# Patient Record
Sex: Male | Born: 1990 | Race: White | Hispanic: No | Marital: Single | Smoking: Former smoker
Health system: Southern US, Community
[De-identification: ages and names within clinical notes are randomized; demographics above are authoritative.]

## PROBLEM LIST (undated history)

## (undated) DIAGNOSIS — Z789 Other specified health status: Secondary | ICD-10-CM

## (undated) DIAGNOSIS — Z91018 Allergy to other foods: Secondary | ICD-10-CM

## (undated) HISTORY — PX: WISDOM TOOTH EXTRACTION: SHX21

## (undated) HISTORY — PX: NO PAST SURGERIES: SHX2092

---

## 2009-07-21 ENCOUNTER — Ambulatory Visit: Payer: Self-pay | Admitting: Internal Medicine

## 2017-11-02 ENCOUNTER — Other Ambulatory Visit: Payer: Self-pay

## 2017-11-02 ENCOUNTER — Encounter: Payer: Self-pay | Admitting: Emergency Medicine

## 2017-11-02 ENCOUNTER — Ambulatory Visit
Admission: EM | Admit: 2017-11-02 | Discharge: 2017-11-02 | Disposition: A | Discharge status: Home / Self Care | Payer: 59

## 2017-11-02 DIAGNOSIS — L0291 Cutaneous abscess, unspecified: Secondary | ICD-10-CM | POA: Insufficient documentation

## 2017-11-02 DIAGNOSIS — L03114 Cellulitis of left upper limb: Secondary | ICD-10-CM | POA: Diagnosis not present

## 2017-11-02 DIAGNOSIS — L02414 Cutaneous abscess of left upper limb: Secondary | ICD-10-CM

## 2017-11-02 MED ORDER — CEPHALEXIN 500 MG PO CAPS: 500.0000 mg | ORAL_CAPSULE | Freq: Two times a day (BID) | ORAL | 0 refills | Status: DC

## 2017-11-02 NOTE — ED Provider Notes (Signed)
[facility]    CSN: [id number] Arrival date & time: 11/02/17  1736     History   Chief Complaint Chief Complaint  Patient presents with  . Cellulitis  . Insect Bite    HPI Hector Cox is a 27 y.o. male.   HPI  27 year old male presents with a left forearm cellulitis occurred roughly 6 days ago.  He was seen in the emergency department [facility] emergency and then again at the [facility] walk-in clinic.  On both occasions he was prescribed different medications for MRSA coverage the first with doxycycline and it was discontinued and then eventually placed on Septra which she is currently taking.  At this it has continued to fester and does seem to be worsening instead of better.  Continue taking the Septra.  Had no fever or chills.  Area of firmness and the redness has him increased.                 History reviewed. No pertinent past medical history.  There are no active problems to display for this patient.   History reviewed. No pertinent surgical history.     Home Medications    Prior to Admission medications   Medication Sig Start Date End Date Taking? Authorizing Provider  sulfamethoxazole-trimethoprim (BACTRIM DS,SEPTRA DS) 800-160 MG tablet Take by mouth. 10/30/17 11/09/17 Yes [provider]  cephALEXin (KEFLEX) 500 MG capsule Take 1 capsule (500 mg total) by mouth 2 (two) times daily. 11/02/17   Lutricia Feil, PA-C    Family History History reviewed. No pertinent family history.  Social History Social History   Tobacco Use  . Smoking status: Current Some Day Smoker    Types: Cigarettes  . Smokeless tobacco: Current User    Types: Chew  Substance Use Topics  . Alcohol use: Yes  . Drug use: Never     Allergies   Patient has no known allergies.   Review of Systems Review of Systems  Constitutional: Positive for activity change. Negative for appetite change, chills, fatigue and fever.  Skin: Positive for  color change and wound.  All other systems reviewed and are negative.    Physical Exam Triage Vital Signs ED Triage Vitals  Enc Vitals Group     BP 11/02/17 1752 (!) 125/93     Pulse Rate 11/02/17 1752 96     Resp 11/02/17 1752 16     Temp 11/02/17 1752 98.4 F (36.9 C)     Temp Source 11/02/17 1752 Oral     SpO2 11/02/17 1752 100 %     Weight 11/02/17 1750 167 lb (75.8 kg)     Height 11/02/17 1750 6' (1.829 m)     Head Circumference --      Peak Flow --      Pain Score 11/02/17 1750 0     Pain Loc --      Pain Edu? --      Excl. in GC? --    No data found.  Updated Vital Signs BP (!) 125/93 (BP Location: Left Arm)   Pulse 96   Temp 98.4 F (36.9 C) (Oral)   Resp 16   Ht 6' (1.829 m)   Wt 167 lb (75.8 kg)   SpO2 100%   BMI 22.65 kg/m   Visual Acuity Right Eye Distance:   Left Eye Distance:   Bilateral Distance:    Right Eye Near:   Left Eye Near:    Bilateral Near:  Physical Exam  Constitutional: He is oriented to person, place, and time. He appears well-developed and well-nourished. No distress.  HENT:  Head: Normocephalic.  Eyes: Pupils are equal, round, and reactive to light. Right eye exhibits no discharge. Left eye exhibits no discharge.  Neck: Normal range of motion.  Musculoskeletal: Normal range of motion. He exhibits tenderness.  Neurological: He is alert and oriented to person, place, and time.  Skin: Skin is warm and dry. He is not diaphoretic. There is erythema.  Examination of the left forearm over the ulna mid point shows a 2-1/2 cm in diameter cyst.  There is eschar in the center portion.  Palpation gentle squeezing allows slight discharge of purulence and serosanguineous discharge.  Psychiatric: He has a normal mood and affect. His behavior is normal. Judgment and thought content normal.  Nursing note and vitals reviewed.    UC Treatments / Results  Labs (all labs ordered are listed, but only abnormal results are displayed) Labs  Reviewed  AEROBIC CULTURE (SUPERFICIAL SPECIMEN)    EKG None  Radiology No results found.  Procedures Incision and Drainage Date/Time: 11/02/2017 7:15 PM Performed by: Lutricia Feil, PA-C Authorized by: Lutricia Feil, PA-C   Consent:    Consent obtained:  Verbal   Consent given by:  Patient   Risks discussed:  Bleeding and pain Location:    Type:  Abscess   Location:  Upper extremity   Upper extremity location:  Arm   Arm location:  L lower arm Pre-procedure details:    Skin preparation:  Chloraprep Anesthesia (see MAR for exact dosages):    Anesthesia method:  Local infiltration Procedure type:    Complexity:  Complex Procedure details:    Needle aspiration: no     Incision types:  Single straight   Incision depth:  Subcutaneous   Scalpel blade:  11   Wound management:  Probed and deloculated and debrided   Drainage:  Purulent   Drainage amount:  Moderate   Wound treatment:  Drain placed and wound left open   Packing materials:  1/4 in gauze   Amount 1/4":  3 Post-procedure details:    Patient tolerance of procedure:  Tolerated well, no immediate complications Comments:     Excision of the eschar showed it to be very deep-seated with purulence extending deep into the wound.  This was all excised and debrided creating a crater extending approximaly 6 mm  deep and creating a crater of approximately 3 to 4 mm wide.  Was debrided sharply of all devitalized tissue.  Quarter inch gauze was placed into the depths of the wound keep wound keep open and draining.  A dry sterile dressing was then applied.  Scar and adherent purulent appearing tissue was then submitted for laboratory analysis.  We will keep the area dry and have follow-up in 2 days for possible repacking.  Added Keflex to his regimen of antibiotics for broader coverage.   (including critical care time)  Medications Ordered in UC Medications - No data to display  Initial Impression / Assessment and Plan  / UC Course  I have reviewed the triage vital signs and the nursing notes.  Pertinent labs & imaging results that were available during my care of the patient were reviewed by me and considered in my medical decision making (see chart for details).     Patient will return in 2 days for removal of the packing and repacking if necessary.  He will keep the area dry until  follow-up in 2 days.  If he has any worsening- if he develops any fever or has red streaks extending up his forearm he will go to the emergency room.  Take ibuprofen and Tylenol combined for pain control. Final Clinical Impressions(s) / UC Diagnoses   Final diagnoses:  Abscess     Discharge Instructions     Reinforce your dressing as necessary for drainage which is expected.  Any signs or symptoms of infection that we discussed including increased pain increased drainage fever chills red streaks going up your arm or go to the emergency room or return to our clinic immediately.  Need to return in 2 days for a wound check and for repacking.  Add Keflex to your current regimen of Bactrim for antibiotic   ED Prescriptions    Medication Sig Dispense Auth. Provider   cephALEXin (KEFLEX) 500 MG capsule Take 1 capsule (500 mg total) by mouth 2 (two) times daily. 10 capsule Lutricia Feil, PA-C     Controlled Substance Prescriptions [place] Controlled Substance Registry consulted? Not Applicable   Lutricia Feil, PA-C 11/03/17 1732

## 2017-11-02 NOTE — Discharge Instructions (Signed)
Reinforce your dressing as necessary for drainage which is expected.  Any signs or symptoms of infection that we discussed including increased pain increased drainage fever chills red streaks going up your arm or go to the emergency room or return to our clinic immediately.  Need to return in 2 days for a wound check and for repacking.  Add Keflex to your current regimen of Bactrim for antibiotic

## 2017-11-02 NOTE — ED Triage Notes (Signed)
Patient c/o possible spider bite on his left forearm since Saturday.  Patient seen at the ED and has been treated for cellulitis.  Patient currently on an antibiotic.  Patient here for follow-up.

## 2017-11-05 ENCOUNTER — Ambulatory Visit
Admission: EM | Admit: 2017-11-05 | Discharge: 2017-11-05 | Disposition: A | Discharge status: Home / Self Care | Payer: 59 | Attending: Emergency Medicine | Admitting: Emergency Medicine

## 2017-11-05 ENCOUNTER — Encounter: Payer: Self-pay | Admitting: Emergency Medicine

## 2017-11-05 ENCOUNTER — Other Ambulatory Visit: Payer: Self-pay

## 2017-11-05 DIAGNOSIS — Z5189 Encounter for other specified aftercare: Secondary | ICD-10-CM

## 2017-11-05 DIAGNOSIS — L02414 Cutaneous abscess of left upper limb: Secondary | ICD-10-CM

## 2017-11-05 DIAGNOSIS — B9562 Methicillin resistant Staphylococcus aureus infection as the cause of diseases classified elsewhere: Secondary | ICD-10-CM | POA: Diagnosis not present

## 2017-11-05 DIAGNOSIS — Z4801 Encounter for change or removal of surgical wound dressing: Secondary | ICD-10-CM | POA: Diagnosis not present

## 2017-11-05 DIAGNOSIS — T8141XA Infection following a procedure, superficial incisional surgical site, initial encounter: Secondary | ICD-10-CM | POA: Diagnosis not present

## 2017-11-05 DIAGNOSIS — A4902 Methicillin resistant Staphylococcus aureus infection, unspecified site: Secondary | ICD-10-CM

## 2017-11-05 LAB — AEROBIC CULTURE W GRAM STAIN (SUPERFICIAL SPECIMEN)

## 2017-11-05 LAB — AEROBIC CULTURE  (SUPERFICIAL SPECIMEN): Special Requests: NORMAL

## 2017-11-05 NOTE — Discharge Instructions (Addendum)
Recommend continue Bactrim antibiotic and Keflex antibiotic as prescribed. We repacked your wound today, keep covered and clean. Follow-up in 2 to 3 days (either Wednesday 9/11 or Thursday 9/12) here for recheck.

## 2017-11-05 NOTE — ED Triage Notes (Signed)
Patient in today for follow up of cyst on his left arm.

## 2017-11-05 NOTE — ED Provider Notes (Signed)
[facility]    CSN: [id number] Arrival date & time: 11/05/17  1654     History   Chief Complaint Chief Complaint  Patient presents with  . Follow-up    cyst    HPI Hector Cox is a 27 y.o. male.   27 year old male presents for follow-up for wound care and recheck from 11/02/17. He was originally seen at [facility] about 10 days ago with small cyst and infection on left forearm. He was placed on Doxycycline but redness and swelling continued to get worse so he went to [facility] later that day. He was switched to Septra. The abscess continued to get larger and he was seen here on 11/02/17 and I & D of the abscess was performed, wound culture was obtained and Keflex was added to the Septra. We had packed the wound on 9/6 and the area is improving. Less pain and redness. His wound culture results came back positive for MRSA and sensitive to Septra. He is here for repacking of wound and recheck. Does smoke cigarettes daily. No other chronic health issues. Takes no daily routine medication.   The history is provided by the patient.    History reviewed. No pertinent past medical history.  There are no active problems to display for this patient.   History reviewed. No pertinent surgical history.     Home Medications    Prior to Admission medications   Medication Sig Start Date End Date Taking? Authorizing Provider  cephALEXin (KEFLEX) 500 MG capsule Take 1 capsule (500 mg total) by mouth 2 (two) times daily. 11/02/17  Yes Lutricia Feil, PA-C  naproxen sodium (ALEVE) 220 MG tablet Take 440 mg by mouth daily as needed.   Yes [provider]  sulfamethoxazole-trimethoprim (BACTRIM DS,SEPTRA DS) 800-160 MG tablet Take by mouth. 10/30/17 11/09/17 Yes [provider]    Family History Family History  Problem Relation Age of Onset  . Healthy Mother   . Hypertension Father   . Heart disease Father   . Alcohol abuse Father      Social History Social History   Tobacco Use  . Smoking status: Current Some Day Smoker    Types: Cigarettes  . Smokeless tobacco: Current User    Types: Chew  Substance Use Topics  . Alcohol use: Yes    Comment: socially  . Drug use: Never     Allergies   Patient has no known allergies.   Review of Systems Review of Systems  Constitutional: Negative for activity change, appetite change, chills, fatigue and fever.  Respiratory: Negative for cough, chest tightness, shortness of breath and wheezing.   Gastrointestinal: Negative for abdominal pain, nausea and vomiting.  Musculoskeletal: Negative for arthralgias and myalgias.  Skin: Positive for color change and wound. Negative for rash.  Allergic/Immunologic: Negative for immunocompromised state.  Neurological: Negative for dizziness, tremors, seizures, syncope, weakness, light-headedness, numbness and headaches.  Hematological: Negative for adenopathy. Does not bruise/bleed easily.     Physical Exam Triage Vital Signs ED Triage Vitals  Enc Vitals Group     BP 11/05/17 1707 121/74     Pulse Rate 11/05/17 1707 83     Resp 11/05/17 1707 16     Temp 11/05/17 1707 98.4 F (36.9 C)     Temp Source 11/05/17 1707 Oral     SpO2 11/05/17 1707 99 %     Weight 11/05/17 1706 167 lb 1.7 oz (75.8 kg)  Height 11/05/17 1706 6' (1.829 m)     Head Circumference --      Peak Flow --      Pain Score 11/05/17 1706 0     Pain Loc --      Pain Edu? --      Excl. in GC? --    No data found.  Updated Vital Signs BP 121/74 (BP Location: Right Arm)   Pulse 83   Temp 98.4 F (36.9 C) (Oral)   Resp 16   Ht 6' (1.829 m)   Wt 167 lb 1.7 oz (75.8 kg)   SpO2 99%   BMI 22.66 kg/m   Visual Acuity Right Eye Distance:   Left Eye Distance:   Bilateral Distance:    Right Eye Near:   Left Eye Near:    Bilateral Near:     Physical Exam  Constitutional: He is oriented to person, place, and time. Vital signs are normal. He  appears well-developed and well-nourished. He is cooperative. He does not appear ill. No distress.  Patient sitting comfortably on exam table in no acute distress.   HENT:  Head: Normocephalic and atraumatic.  Eyes: Conjunctivae and EOM are normal.  Neck: Normal range of motion.  Cardiovascular: Normal rate.  Pulmonary/Chest: Effort normal.  Musculoskeletal: Normal range of motion.       Left forearm: He exhibits tenderness and swelling.       Arms: Open wound about 5mm in diameter present on left outer forearm near elbow. Minimal swelling. Slight redness along wound border but no surrounding redness.   Neurological: He is alert and oriented to person, place, and time.  Skin: Skin is warm and dry. Capillary refill takes less than 2 seconds. No bruising, no ecchymosis and no rash noted. There is erythema (slight at wound).  Psychiatric: He has a normal mood and affect. His behavior is normal. Judgment and thought content normal.  Vitals reviewed.    UC Treatments / Results  Labs (all labs ordered are listed, but only abnormal results are displayed) Labs Reviewed - No data to display  EKG None  Radiology No results found.  Procedures Wound Care Date/Time: 11/05/2017 6:21 PM Performed by: Sudie Grumbling, NP Authorized by: Domenick Gong, MD   Consent:    Consent obtained:  Verbal   Consent given by:  Patient   Risks discussed:  Pain, poor cosmetic result and bleeding   Alternatives discussed:  Alternative treatment, observation and delayed treatment Anesthesia (see MAR for exact dosages):    Anesthesia method:  None Procedure details:    Indications: open wounds and skin infection     Wound exploration location: extremity     Wound age (days):  3 Dressing:    Packing/drain action: new packing and packing change     Packing material:  Iodoform 1/4 inch   Dressing applied:  Telfa pad   Wrapped with:  Coban 4 inch Post-procedure details:    Patient tolerance of  procedure:  Tolerated well, no immediate complications Comments:     Removed packing material with ease. Minimal discomfort. Cleaned area with 5ml of normal saline. Depth of wound is about 4mm. Repacked wound with Iodoform. Covered with Telfa pad and Coban.    (including critical care time)  Medications Ordered in UC Medications - No data to display  Initial Impression / Assessment and Plan / UC Course  I have reviewed the triage vital signs and the nursing notes.  Pertinent labs & imaging results that were  available during my care of the patient were reviewed by me and considered in my medical decision making (see chart for details).    We repacked wound today. Healing well. Keep covered and clean. Continue Septra and Keflex as directed. Follow-up here in 2 to 3 days for wound recheck.  Final Clinical Impressions(s) / UC Diagnoses   Final diagnoses:  Infection of wound due to methicillin resistant Staphylococcus aureus (MRSA)  Encounter for wound care     Discharge Instructions     Recommend continue Bactrim antibiotic and Keflex antibiotic as prescribed. We repacked your wound today, keep covered and clean. Follow-up in 2 to 3 days (either Wednesday 9/11 or Thursday 9/12) here for recheck.     ED Prescriptions    None     Controlled Substance Prescriptions [place] Controlled Substance Registry consulted? Not Applicable   Sudie Grumbling, NP 11/06/17 [phone]

## 2017-11-06 ENCOUNTER — Telehealth (HOSPITAL_COMMUNITY): Payer: Self-pay

## 2017-11-06 NOTE — Telephone Encounter (Signed)
Pt is taking bactrim which is appropriate per culture. Pt called and made aware. Reports wound looking better.

## 2017-11-08 ENCOUNTER — Encounter: Payer: Self-pay | Admitting: Emergency Medicine

## 2017-11-08 ENCOUNTER — Other Ambulatory Visit: Payer: Self-pay

## 2017-11-08 ENCOUNTER — Ambulatory Visit
Admission: EM | Admit: 2017-11-08 | Discharge: 2017-11-08 | Disposition: A | Discharge status: Home / Self Care | Payer: PRIVATE HEALTH INSURANCE | Attending: Family Medicine | Admitting: Family Medicine

## 2017-11-08 DIAGNOSIS — L02414 Cutaneous abscess of left upper limb: Secondary | ICD-10-CM

## 2017-11-08 DIAGNOSIS — Z5189 Encounter for other specified aftercare: Secondary | ICD-10-CM

## 2017-11-08 NOTE — Discharge Instructions (Signed)
Daily dressing change.  Clean with soap and water.  Take care  Dr. Adriana Simasook

## 2017-11-08 NOTE — ED Triage Notes (Signed)
Pt here for follow up and packing removal. abscess is located on his left forearm. He reports that it is feeling better, he has started seeing redness and blood. He has been changing the daily and keeping it clean.Hector Cox. He is still taking bactrim.

## 2017-11-08 NOTE — ED Provider Notes (Signed)
[facility]    CSN: [id number] Arrival date & time: 11/08/17  1654  History   Chief Complaint Chief Complaint  Patient presents with  . Wound Check    left forearm, abcess packing removal   HPI  27 year old male presents for wound recheck and packing removal.  Patient was last seen on 9/9.  At that time his wound was repacked.  He remains on Bactrim.  His culture grew MRSA which is sensitive to Bactrim.  Patient states that he is doing well.  Area has dramatically improved.  Patient has no concerns at this time.  Patient here for packing removal and reassessment.  Social History Social History   Tobacco Use  . Smoking status: Current Some Day Smoker    Types: Cigarettes  . Smokeless tobacco: Current User    Types: Chew  Substance Use Topics  . Alcohol use: Yes    Comment: socially  . Drug use: Never     Allergies   Patient has no known allergies.   Review of Systems Review of Systems  Constitutional: Negative for fever.  Skin: Positive for wound.   Physical Exam Triage Vital Signs ED Triage Vitals  Enc Vitals Group     BP 11/08/17 1717 117/71     Pulse Rate 11/08/17 1717 90     Resp 11/08/17 1717 17     Temp 11/08/17 1717 98.5 F (36.9 C)     Temp Source 11/08/17 1717 Oral     SpO2 11/08/17 1717 99 %     Weight 11/08/17 1715 167 lb (75.8 kg)     Height 11/08/17 1715 6' (1.829 m)     Head Circumference --      Peak Flow --      Pain Score 11/08/17 1715 0     Pain Loc --      Pain Edu? --      Excl. in GC? --    Updated Vital Signs BP 117/71 (BP Location: Left Arm)   Pulse 90   Temp 98.5 F (36.9 C) (Oral)   Resp 17   Ht 6' (1.829 m)   Wt 75.8 kg   SpO2 99%   BMI 22.65 kg/m   Visual Acuity Right Eye Distance:   Left Eye Distance:   Bilateral Distance:    Right Eye Near:   Left Eye Near:    Bilateral Near:     Physical Exam  Constitutional: He appears well-developed. No distress.  HENT:  Head: Normocephalic.    Pulmonary/Chest: Effort normal. No respiratory distress.  Neurological: He is alert.  Skin:  Left forearm -abscess does not appear to be draining currently.  No surrounding erythema.  Not particularly tender.  Packing in place.  Nursing note and vitals reviewed.  UC Treatments / Results  Labs (all labs ordered are listed, but only abnormal results are displayed) Labs Reviewed - No data to display  EKG None  Radiology No results found.  Procedures Procedures (including critical care time)  Medications Ordered in UC Medications - No data to display  Initial Impression / Assessment and Plan / UC Course  I have reviewed the triage vital signs and the nursing notes.  Pertinent labs & imaging results that were available during my care of the patient were reviewed by me and considered in my medical decision making (see chart for details).    27 year old male presents for wound check.  Packing removed.  No current purulence.  Appears to be healing appropriately.  Granulation tissue noted.  No need for further packing.  Daily dressing changes.  Clean with soap and water.  Supportive care.  Final Clinical Impressions(s) / UC Diagnoses   Final diagnoses:  Wound check, abscess     Discharge Instructions     Daily dressing change.  Clean with soap and water.  Take care  Dr. Adriana Simas    ED Prescriptions    None     Controlled Substance Prescriptions  Controlled Substance Registry consulted? Not Applicable   Tommie Sams, DO 11/08/17 1734

## 2017-12-31 ENCOUNTER — Ambulatory Visit
Admission: EM | Admit: 2017-12-31 | Discharge: 2017-12-31 | Disposition: A | Discharge status: Home / Self Care | Payer: 59 | Attending: Family Medicine | Admitting: Family Medicine

## 2017-12-31 ENCOUNTER — Ambulatory Visit (INDEPENDENT_AMBULATORY_CARE_PROVIDER_SITE_OTHER): Payer: 59

## 2017-12-31 ENCOUNTER — Other Ambulatory Visit: Payer: Self-pay

## 2017-12-31 DIAGNOSIS — M25511 Pain in right shoulder: Secondary | ICD-10-CM

## 2017-12-31 DIAGNOSIS — S46911A Strain of unspecified muscle, fascia and tendon at shoulder and upper arm level, right arm, initial encounter: Secondary | ICD-10-CM | POA: Diagnosis not present

## 2017-12-31 DIAGNOSIS — S29012A Strain of muscle and tendon of back wall of thorax, initial encounter: Secondary | ICD-10-CM

## 2017-12-31 MED ORDER — CYCLOBENZAPRINE HCL 10 MG PO TABS: 10.0000 mg | ORAL_TABLET | Freq: Every day | ORAL | 0 refills | Status: DC

## 2017-12-31 NOTE — ED Provider Notes (Signed)
[facility]    CSN: [id number] Arrival date & time: 12/31/17  1242     History   Chief Complaint Chief Complaint  Patient presents with  . Shoulder Pain    right    HPI Hector Cox is a 27 y.o. male.   26 yo male with a c/o right shoulder pain for about 6 days. Denies any specific injury or fall, however states has been doing some heavy work at home and work. Denies any numbness/tingling, redness, rash.   The history is provided by the patient.  Shoulder Pain    History reviewed. No pertinent past medical history.  There are no active problems to display for this patient.   Past Surgical History:  Procedure Laterality Date  . NO PAST SURGERIES         Home Medications    Prior to Admission medications   Medication Sig Start Date End Date Taking? Authorizing Provider  naproxen sodium (ALEVE) 220 MG tablet Take 440 mg by mouth daily as needed.   Yes [provider]  cephALEXin (KEFLEX) 500 MG capsule Take 1 capsule (500 mg total) by mouth 2 (two) times daily. 11/02/17   Lutricia Feil, PA-C  cyclobenzaprine (FLEXERIL) 10 MG tablet Take 1 tablet (10 mg total) by mouth at bedtime. 12/31/17   Payton Mccallum, MD    Family History Family History  Problem Relation Age of Onset  . Healthy Mother   . Hypertension Father   . Heart disease Father   . Alcohol abuse Father     Social History Social History   Tobacco Use  . Smoking status: Current Some Day Smoker    Types: Cigarettes  . Smokeless tobacco: Current User    Types: Chew  Substance Use Topics  . Alcohol use: Yes    Comment: socially  . Drug use: Never     Allergies   Patient has no known allergies.   Review of Systems Review of Systems   Physical Exam Triage Vital Signs ED Triage Vitals  Enc Vitals Group     BP 12/31/17 1250 117/82     Pulse Rate 12/31/17 1250 87     Resp 12/31/17 1250 18     Temp 12/31/17 1250 98.1 F (36.7 C)     Temp Source 12/31/17  1250 Oral     SpO2 12/31/17 1250 100 %     Weight 12/31/17 1248 175 lb (79.4 kg)     Height 12/31/17 1248 6' (1.829 m)     Head Circumference --      Peak Flow --      Pain Score 12/31/17 1248 8     Pain Loc --      Pain Edu? --      Excl. in GC? --    No data found.  Updated Vital Signs BP 117/82 (BP Location: Left Arm)   Pulse 87   Temp 98.1 F (36.7 C) (Oral)   Resp 18   Ht 6' (1.829 m)   Wt 79.4 kg   SpO2 100%   BMI 23.73 kg/m   Visual Acuity Right Eye Distance:   Left Eye Distance:   Bilateral Distance:    Right Eye Near:   Left Eye Near:    Bilateral Near:     Physical Exam  Constitutional: He appears well-developed and well-nourished. No distress.  Musculoskeletal:       Right shoulder: He exhibits tenderness and spasm. He exhibits normal range of motion,  no bony tenderness, no swelling, no effusion, no crepitus, no deformity, no laceration, normal pulse and normal strength.       Cervical back: He exhibits tenderness (over the trapezius) and spasm. He exhibits normal range of motion, no bony tenderness, no swelling, no edema, no deformity, no laceration and normal pulse.  Skin: He is not diaphoretic.  Nursing note and vitals reviewed.    UC Treatments / Results  Labs (all labs ordered are listed, but only abnormal results are displayed) Labs Reviewed - No data to display  EKG None  Radiology Dg Shoulder Right  Result Date: 12/31/2017 CLINICAL DATA:  Right shoulder pain which began last week. EXAM: RIGHT SHOULDER - 2+ VIEW COMPARISON:  None FINDINGS: There is no evidence of fracture or dislocation. There is no evidence of arthropathy or other focal bone abnormality. Soft tissues are unremarkable. IMPRESSION: Negative. Electronically Signed   By: Signa Kell M.D.   On: 12/31/2017 13:22    Procedures Procedures (including critical care time)  Medications Ordered in UC Medications - No data to display  Initial Impression / Assessment and Plan /  UC Course  I have reviewed the triage vital signs and the nursing notes.  Pertinent labs & imaging results that were available during my care of the patient were reviewed by me and considered in my medical decision making (see chart for details).      Final Clinical Impressions(s) / UC Diagnoses   Final diagnoses:  Acute pain of right shoulder  Strain of right shoulder, initial encounter  Upper back strain, initial encounter     Discharge Instructions     Heat, massage, stretch, TENS    ED Prescriptions    Medication Sig Dispense Auth. Provider   cyclobenzaprine (FLEXERIL) 10 MG tablet Take 1 tablet (10 mg total) by mouth at bedtime. 30 tablet Payton Mccallum, MD     1. x-ray result (negative shoulder) and diagnosis reviewed with patient 2. rx as per orders above; reviewed possible side effects, interactions, risks and benefits  3. Recommend supportive treatment with otc analgesics prn and as above  4. Follow-up prn if symptoms worsen or don't improve   Controlled Substance Prescriptions [place] Controlled Substance Registry consulted? Not Applicable   Payton Mccallum, MD 12/31/17 [phone]

## 2017-12-31 NOTE — Discharge Instructions (Signed)
Heat, massage, stretch, TENS

## 2017-12-31 NOTE — ED Triage Notes (Signed)
Patient complains of right shoulder pain that started last week. Patient states that pain radiates all around shoulder.

## 2018-01-17 ENCOUNTER — Encounter: Payer: Self-pay | Admitting: Emergency Medicine

## 2018-01-17 ENCOUNTER — Other Ambulatory Visit: Payer: Self-pay

## 2018-01-17 ENCOUNTER — Ambulatory Visit
Admission: EM | Admit: 2018-01-17 | Discharge: 2018-01-17 | Disposition: A | Discharge status: Home / Self Care | Payer: 59 | Attending: Family Medicine | Admitting: Family Medicine

## 2018-01-17 DIAGNOSIS — J069 Acute upper respiratory infection, unspecified: Secondary | ICD-10-CM

## 2018-01-17 DIAGNOSIS — B9789 Other viral agents as the cause of diseases classified elsewhere: Secondary | ICD-10-CM

## 2018-01-17 DIAGNOSIS — J020 Streptococcal pharyngitis: Secondary | ICD-10-CM

## 2018-01-17 LAB — RAPID STREP SCREEN (MED CTR MEBANE ONLY): Streptococcus, Group A Screen (Direct): NEGATIVE

## 2018-01-17 MED ORDER — BENZONATATE 100 MG PO CAPS: 100.0000 mg | ORAL_CAPSULE | Freq: Three times a day (TID) | ORAL | 0 refills | Status: DC | PRN

## 2018-01-17 MED ORDER — CETIRIZINE-PSEUDOEPHEDRINE ER 5-120 MG PO TB12: 1.0000 | ORAL_TABLET | Freq: Every day | ORAL | 0 refills | Status: DC

## 2018-01-17 MED ORDER — LIDOCAINE VISCOUS HCL 2 % MT SOLN: OROMUCOSAL | 0 refills | Status: DC

## 2018-01-17 NOTE — Discharge Instructions (Signed)
This is viral.  Strep negative.  Medications as prescribed.  Take care  Dr. Adriana Simasook

## 2018-01-17 NOTE — ED Provider Notes (Signed)
[facility]    CSN: [id number] Arrival date & time: 01/17/18  1191  History   Chief Complaint Chief Complaint  Patient presents with  . Sore Throat    APPT  . Cough    HPI  27 year old male presents with the above complaints.  2-3 day history of cough, congestion, sore throat.  Associated achiness and fatigue.  No fever.  He has been taking DayQuil and NyQuil with some improvement.  Reports sick contacts at work.  No known exacerbating factors.  Symptoms are worsening.  Moderate in severity.  No other associated symptoms.  No other complaints.  Social hx reviewed. Social History Social History   Tobacco Use  . Smoking status: Current Some Day Smoker    Types: Cigarettes  . Smokeless tobacco: Current User    Types: Chew  Substance Use Topics  . Alcohol use: Yes    Comment: socially  . Drug use: Never     Allergies   Patient has no known allergies.   Review of Systems Review of Systems Per HPI  Physical Exam Triage Vital Signs ED Triage Vitals  Enc Vitals Group     BP 01/17/18 0947 110/75     Pulse Rate 01/17/18 0947 96     Resp 01/17/18 0947 18     Temp 01/17/18 0947 98.5 F (36.9 C)     Temp Source 01/17/18 0947 Oral     SpO2 01/17/18 0947 100 %     Weight 01/17/18 0946 172 lb (78 kg)     Height 01/17/18 0946 6' (1.829 m)     Head Circumference --      Peak Flow --      Pain Score 01/17/18 0945 4     Pain Loc --      Pain Edu? --      Excl. in GC? --    Updated Vital Signs BP 110/75 (BP Location: Right Arm)   Pulse 96   Temp 98.5 F (36.9 C) (Oral)   Resp 18   Ht 6' (1.829 m)   Wt 78 kg   SpO2 100%   BMI 23.33 kg/m   Visual Acuity Right Eye Distance:   Left Eye Distance:   Bilateral Distance:    Right Eye Near:   Left Eye Near:    Bilateral Near:     Physical Exam  Constitutional: He is oriented to person, place, and time. He appears well-developed. No distress.  HENT:  Head: Normocephalic and atraumatic.    Oropharynx with mild erythema.  Eyes: Conjunctivae are normal. Right eye exhibits no discharge. Left eye exhibits no discharge.  Cardiovascular: Normal rate and regular rhythm.  Pulmonary/Chest: Effort normal and breath sounds normal. He has no wheezes. He has no rales.  Neurological: He is alert and oriented to person, place, and time.  Psychiatric: He has a normal mood and affect. His behavior is normal.  Nursing note and vitals reviewed.  UC Treatments / Results  Labs (all labs ordered are listed, but only abnormal results are displayed) Labs Reviewed  RAPID STREP SCREEN ([facility] ONLY)  CULTURE, GROUP A STREP [facility])    EKG None  Radiology No results found.  Procedures Procedures (including critical care time)  Medications Ordered in UC Medications - No data to display  Initial Impression / Assessment and Plan / UC Course  I have reviewed the triage vital signs and the nursing notes.  Pertinent labs & imaging results that were available during my care  of the patient were reviewed by me and considered in my medical decision making (see chart for details).    27 year old male presents with a viral URI with cough.  Treating with Zyrtec-D, Tessalon Perles, viscous lidocaine.  Final Clinical Impressions(s) / UC Diagnoses   Final diagnoses:  Viral URI with cough     Discharge Instructions     This is viral.  Strep negative.  Medications as prescribed.  Take care  Dr. Adriana Simasook     ED Prescriptions    Medication Sig Dispense Auth. Provider   cetirizine-pseudoephedrine (ZYRTEC-D) 5-120 MG tablet Take 1 tablet by mouth daily. 30 tablet Shizuye Rupert G, DO   benzonatate (TESSALON) 100 MG capsule Take 1 capsule (100 mg total) by mouth 3 (three) times daily as needed. 30 capsule Marsean Elkhatib G, DO   lidocaine (XYLOCAINE) 2 % solution Gargle 15 mL every 3 hours as needed. May swallow if desired. 200 mL Tommie Samsook, Zach Tietje G, DO     Controlled Substance Prescriptions [place]  Controlled Substance Registry consulted? Yes, I have consulted the [place] Controlled Substances Registry for this patient, and feel the risk/benefit ratio today is favorable for proceeding with this prescription for a controlled substance.   Tommie SamsCook, Arnulfo Batson G, [place] 01/17/18 1205

## 2018-01-17 NOTE — ED Triage Notes (Signed)
Patient c/o sore throat and cough that started 2 days ago. Patient has been taking Dayquil and Nyquil OTC.

## 2018-01-20 LAB — CULTURE, GROUP A STREP (THRC)

## 2018-01-21 ENCOUNTER — Telehealth (HOSPITAL_COMMUNITY): Payer: Self-pay

## 2018-01-21 NOTE — Telephone Encounter (Signed)
Culture is positive for non group A Strep germ.  This is a finding of uncertain significance; not the typical 'strep throat' germ. Pt is afebrile and no sore throat noted at present. Pt verbalized understanding. Recheck for further evaluation if symptoms are not improving

## 2018-02-04 ENCOUNTER — Ambulatory Visit
Admission: EM | Admit: 2018-02-04 | Discharge: 2018-02-04 | Disposition: A | Discharge status: Home / Self Care | Payer: 59 | Attending: Family Medicine | Admitting: Family Medicine

## 2018-02-04 ENCOUNTER — Encounter: Payer: Self-pay | Admitting: Emergency Medicine

## 2018-02-04 ENCOUNTER — Other Ambulatory Visit: Payer: Self-pay

## 2018-02-04 DIAGNOSIS — W5501XA Bitten by cat, initial encounter: Secondary | ICD-10-CM

## 2018-02-04 DIAGNOSIS — S61250A Open bite of right index finger without damage to nail, initial encounter: Secondary | ICD-10-CM | POA: Diagnosis not present

## 2018-02-04 HISTORY — DX: Allergy to other foods: Z91.018

## 2018-02-04 MED ORDER — AMOXICILLIN-POT CLAVULANATE 875-125 MG PO TABS: 1.0000 | ORAL_TABLET | Freq: Two times a day (BID) | ORAL | 0 refills | Status: DC

## 2018-02-04 NOTE — ED Provider Notes (Signed)
[facility]    CSN: [id number] Arrival date & time: 02/04/18  1103  History   Chief Complaint Chief Complaint  Patient presents with  . Animal Bite    APPT   HPI  27 year old male presents with a cat bite.  Patient states that he was bitten by cat last night.  It is his cat.  Was bitten on the right index finger.  Patient this patient states that it was swollen and red this morning.  Cat is up-to-date on vaccinations.  Patient is up-to-date on his tetanus.  No drainage.  Mild redness.  Patient states that he feels like it is improving currently.  No fever.  No chills.  No other associated symptoms.  No other complaints.  PMH, Surgical Hx, Family Hx, Social History reviewed and updated as below.  Past Medical History:  Diagnosis Date  . Allergy to alpha-gal    Past Surgical History:  Procedure Laterality Date  . NO PAST SURGERIES     Home Medications    Prior to Admission medications   Medication Sig Start Date End Date Taking? Authorizing Provider  ALPRAZolam Prudy Feeler(XANAX) 0.25 MG tablet Take 0.25 mg by mouth daily as needed for anxiety.   Yes [provider]  ibuprofen (ADVIL,MOTRIN) 400 MG tablet Take 400 mg by mouth every 8 (eight) hours as needed for moderate pain (back pain).   Yes [provider]  amoxicillin-clavulanate (AUGMENTIN) 875-125 MG tablet Take 1 tablet by mouth every 12 (twelve) hours. 02/04/18   Tommie Samsook, Orel Cooler G, DO   Family History Family History  Problem Relation Age of Onset  . Healthy Mother   . Hypertension Father   . Heart disease Father   . Alcohol abuse Father    Social History Social History   Tobacco Use  . Smoking status: Current Some Day Smoker    Types: Cigarettes  . Smokeless tobacco: Current User    Types: Chew  Substance Use Topics  . Alcohol use: Yes    Comment: socially  . Drug use: Never   Allergies   Beef-derived products and Pork-derived products   Review of Systems Review of Systems    Constitutional: Negative.   Skin:       Cat bite.   Physical Exam Triage Vital Signs ED Triage Vitals  Enc Vitals Group     BP 02/04/18 1117 116/82     Pulse Rate 02/04/18 1117 88     Resp 02/04/18 1117 16     Temp 02/04/18 1117 98 F (36.7 C)     Temp Source 02/04/18 1117 Oral     SpO2 02/04/18 1117 100 %     Weight 02/04/18 1119 175 lb (79.4 kg)     Height 02/04/18 1119 6' (1.829 m)     Head Circumference --      Peak Flow --      Pain Score 02/04/18 1119 0     Pain Loc --      Pain Edu? --      Excl. in GC? --    Updated Vital Signs BP 116/82 (BP Location: Left Arm)   Pulse 88   Temp 98 F (36.7 C) (Oral)   Resp 16   Ht 6' (1.829 m)   Wt 79.4 kg   SpO2 100%   BMI 23.73 kg/m   Visual Acuity Right Eye Distance:   Left Eye Distance:   Bilateral Distance:    Right Eye Near:   Left Eye Near:  Bilateral Near:     Physical Exam  Constitutional: He is oriented to person, place, and time. He appears well-developed. No distress.  HENT:  Head: Normocephalic and atraumatic.  Pulmonary/Chest: Effort normal. No respiratory distress.  Neurological: He is alert and oriented to person, place, and time.  Skin:  Right index finger -small open wound with mild erythema.  No drainage.  Psychiatric: He has a normal mood and affect. His behavior is normal.  Nursing note and vitals reviewed.  UC Treatments / Results  Labs (all labs ordered are listed, but only abnormal results are displayed) Labs Reviewed - No data to display  EKG None  Radiology No results found.  Procedures Procedures (including critical care time)  Medications Ordered in UC Medications - No data to display  Initial Impression / Assessment and Plan / UC Course  I have reviewed the triage vital signs and the nursing notes.  Pertinent labs & imaging results that were available during my care of the patient were reviewed by me and considered in my medical decision making (see chart for  details).    27 year old male presents with a cat bite.  Placing on prophylactic Augmentin.  Final Clinical Impressions(s) / UC Diagnoses   Final diagnoses:  Cat bite, initial encounter     Discharge Instructions     Antibiotic as prescribed.  Take care  Dr. Adriana Simas    ED Prescriptions    Medication Sig Dispense Auth. Provider   amoxicillin-clavulanate (AUGMENTIN) 875-125 MG tablet Take 1 tablet by mouth every 12 (twelve) hours. 14 tablet Tommie Sams, DO     Controlled Substance Prescriptions [place] Controlled Substance Registry consulted? Not Applicable   Tommie Sams, DO 02/04/18 1232

## 2018-02-04 NOTE — Discharge Instructions (Signed)
Antibiotic as prescribed.  Take care  Dr. Lewin Pellow  

## 2018-02-04 NOTE — ED Triage Notes (Addendum)
Patient in today after getting a cat bite to his right index finger yesterday (02/03/18). Patient states it was his cat and that it is UTD on rabies. Patient's last tetanus was 10/30/17 per [facility] records in care everywhere.

## 2018-05-01 ENCOUNTER — Other Ambulatory Visit: Payer: Self-pay

## 2018-05-01 ENCOUNTER — Ambulatory Visit
Admission: EM | Admit: 2018-05-01 | Discharge: 2018-05-01 | Disposition: A | Discharge status: Home / Self Care | Payer: 59 | Attending: Family Medicine | Admitting: Family Medicine

## 2018-05-01 DIAGNOSIS — J989 Respiratory disorder, unspecified: Secondary | ICD-10-CM

## 2018-05-01 DIAGNOSIS — F17210 Nicotine dependence, cigarettes, uncomplicated: Secondary | ICD-10-CM | POA: Diagnosis not present

## 2018-05-01 MED ORDER — BENZONATATE 100 MG PO CAPS: 100.0000 mg | ORAL_CAPSULE | Freq: Three times a day (TID) | ORAL | 0 refills | Status: DC | PRN

## 2018-05-01 NOTE — Discharge Instructions (Signed)
Likely viral.  Rest. Fluids.  Medication as needed.  No evidence of flu.  Take care  Dr. Adriana Simas

## 2018-05-01 NOTE — ED Provider Notes (Signed)
[facility]    CSN: [id number] Arrival date & time: 05/01/18  1252  History   Chief Complaint Chief Complaint  Patient presents with  . Fever   HPI  28 year old male presents with subjective fever, weakness, lethargy.  Patient reports that he has not been feeling well since Friday.  He reports decreased appetite, fatigue, fever, cough.  No documented fever.  He has not taken his temperature.  He reports his cough is productive.  He has been taking DayQuil without improvement.  Patient is concerned primarily because he has a baby at home.  No known exacerbating factors.  No other associated symptoms.  No other complaints.  PMH, Surgical Hx, Family Hx, Social History reviewed and updated as below.  Past Medical History:  Diagnosis Date  . Allergy to alpha-gal    Past Surgical History:  Procedure Laterality Date  . NO PAST SURGERIES     Home Medications    Prior to Admission medications   Medication Sig Start Date End Date Taking? Authorizing Provider  cyclobenzaprine (FLEXERIL) 10 MG tablet Take 10 mg by mouth 3 (three) times daily as needed for muscle spasms.   Yes [provider]  ALPRAZolam (XANAX) 0.25 MG tablet Take 0.25 mg by mouth daily as needed for anxiety.    [provider]  benzonatate (TESSALON) 100 MG capsule Take 1 capsule (100 mg total) by mouth 3 (three) times daily as needed for cough. 05/01/18   Tommie Sams, DO   Family History Family History  Problem Relation Age of Onset  . Healthy Mother   . Hypertension Father   . Heart disease Father   . Alcohol abuse Father    Social History Social History   Tobacco Use  . Smoking status: Current Some Day Smoker    Types: Cigarettes  . Smokeless tobacco: Current User    Types: Chew  Substance Use Topics  . Alcohol use: Yes    Comment: socially  . Drug use: Never    Allergies   Beef-derived products and Pork-derived products   Review of Systems Review of Systems    Constitutional: Positive for appetite change, fatigue and fever.  Respiratory: Positive for cough.    Physical Exam Triage Vital Signs ED Triage Vitals  Enc Vitals Group     BP 05/01/18 1304 115/71     Pulse Rate 05/01/18 1304 99     Resp 05/01/18 1304 16     Temp 05/01/18 1304 98.3 F (36.8 C)     Temp Source 05/01/18 1304 Oral     SpO2 05/01/18 1304 100 %     Weight 05/01/18 1302 170 lb (77.1 kg)     Height 05/01/18 1302 6\' 2"  (1.88 m)     Head Circumference --      Peak Flow --      Pain Score 05/01/18 1302 6     Pain Loc --      Pain Edu? --      Excl. in GC? --    Updated Vital Signs BP 115/71 (BP Location: Left Arm)   Pulse 99   Temp 98.3 F (36.8 C) (Oral)   Resp 16   Ht 6\' 2"  (1.88 m)   Wt 77.1 kg   SpO2 100%   BMI 21.83 kg/m   Visual Acuity Right Eye Distance:   Left Eye Distance:   Bilateral Distance:    Right Eye Near:   Left Eye Near:    Bilateral Near:  Physical Exam Vitals signs and nursing note reviewed.  Constitutional:      General: He is not in acute distress.    Appearance: Normal appearance.  HENT:     Head: Normocephalic and atraumatic.     Right Ear: Tympanic membrane normal.     Left Ear: Tympanic membrane normal.     Mouth/Throat:     Pharynx: Oropharynx is clear. No oropharyngeal exudate or posterior oropharyngeal erythema.  Eyes:     General:        Right eye: No discharge.        Left eye: No discharge.     Conjunctiva/sclera: Conjunctivae normal.  Cardiovascular:     Rate and Rhythm: Normal rate and regular rhythm.  Pulmonary:     Effort: Pulmonary effort is normal.     Breath sounds: Normal breath sounds.  Neurological:     Mental Status: He is alert.  Psychiatric:        Mood and Affect: Mood normal.        Behavior: Behavior normal.    UC Treatments / Results  Labs (all labs ordered are listed, but only abnormal results are displayed) Labs Reviewed - No data to display  EKG None  Radiology No results  found.  Procedures Procedures (including critical care time)  Medications Ordered in UC Medications - No data to display  Initial Impression / Assessment and Plan / UC Course  I have reviewed the triage vital signs and the nursing notes.  Pertinent labs & imaging results that were available during my care of the patient were reviewed by me and considered in my medical decision making (see chart for details).    28 year old male presents with a viral illness.  Tessalon Perles for cough.  Supportive care.  Final Clinical Impressions(s) / UC Diagnoses   Final diagnoses:  Respiratory illness     Discharge Instructions     Likely viral.  Rest. Fluids.  Medication as needed.  No evidence of flu.  Take care  Dr. Adriana Simas    ED Prescriptions    Medication Sig Dispense Auth. Provider   benzonatate (TESSALON) 100 MG capsule Take 1 capsule (100 mg total) by mouth 3 (three) times daily as needed for cough. 30 capsule Tommie Sams, DO     Controlled Substance Prescriptions [place] Controlled Substance Registry consulted? Not Applicable   Tommie Sams, DO 05/01/18 1454

## 2018-05-01 NOTE — ED Triage Notes (Signed)
Patient complains of fever, weak and lethargic. Patient states that symptoms started on Friday but worsened on Sunday.

## 2019-01-21 ENCOUNTER — Encounter: Payer: Self-pay | Admitting: Emergency Medicine

## 2019-01-21 ENCOUNTER — Other Ambulatory Visit: Payer: Self-pay

## 2019-01-21 ENCOUNTER — Ambulatory Visit
Admission: EM | Admit: 2019-01-21 | Discharge: 2019-01-21 | Disposition: A | Discharge status: Home / Self Care | Payer: 59 | Attending: Family Medicine | Admitting: Family Medicine

## 2019-01-21 DIAGNOSIS — J069 Acute upper respiratory infection, unspecified: Secondary | ICD-10-CM | POA: Diagnosis not present

## 2019-01-21 DIAGNOSIS — R059 Cough, unspecified: Secondary | ICD-10-CM

## 2019-01-21 DIAGNOSIS — R05 Cough: Secondary | ICD-10-CM

## 2019-01-21 MED ORDER — BENZONATATE 100 MG PO CAPS: 200.0000 mg | ORAL_CAPSULE | Freq: Three times a day (TID) | ORAL | 0 refills | Status: AC | PRN

## 2019-01-21 NOTE — Discharge Instructions (Addendum)
URI/COLD SYMPTOMS: Your exam today is consistent with a viral illness. Antibiotics are not indicated at this time. Use medications as directed, including cough syrup or tessalon perles for cough, nasal saline, and decongestants. Your symptoms should improve over the next few days and resolve within 7-10 days. Increase rest and fluids. F/u if symptoms worsen or predominate such as sore throat, ear pain, productive cough, shortness of breath, or if you develop high fevers or worsening fatigue over the next several days.   AS I HAVE EXPLAINED COIVID 19 INFECTION CANNOT BE DEFINITIVELY RULED OUT WITHOUT A TEST. YOU HAVE DECLINED TESTING TODAY. PLEASE STAY HOME IF YOU CAN FOR THE NEXT WEEK OTHERWISE SOCIAL DISTANCE, SANITIZE, [place] HANDS, WEAR MASK AND GLOVES IF POSSIBLE AND FOLLOW UP IF FEVER, WORSENING COUGH, BREATHING ISSUE OR LOSS OF SMELL/TASTE.

## 2019-01-21 NOTE — ED Provider Notes (Signed)
[facility]    CSN: [id number] Arrival date & time: 01/21/19  0932      History   Chief Complaint Chief Complaint  Patient presents with  . Cough    HPI Hector Cox is a 28 y.o. male who presents for onset of nasal congestion and cough 3 days ago. Denies any other symptoms and says he is really only here for cough medication since the OTC medications are not helping as much as he hoped. He denies fever, body aches, fatigue, sore throat, chest pain, breathing difficulty, abdominal pain, n/v/d, or changes in taste or smell. Denies known COVID exposure and declines testing. Is otherwise healthy and denies cardiopulmonary disease. No other concerns today.   HPI  Past Medical History:  Diagnosis Date  . Allergy to alpha-gal     There are no active problems to display for this patient.   Past Surgical History:  Procedure Laterality Date  . NO PAST SURGERIES         Home Medications    Prior to Admission medications   Medication Sig Start Date End Date Taking? Authorizing Provider  ALPRAZolam Prudy Feeler) 0.25 MG tablet Take 0.25 mg by mouth daily as needed for anxiety.   Yes [provider]  benzonatate (TESSALON) 100 MG capsule Take 2 capsules (200 mg total) by mouth 3 (three) times daily as needed for up to 7 days for cough. 01/21/19 01/28/19  Eusebio Friendly B, PA-C  cyclobenzaprine (FLEXERIL) 10 MG tablet Take 10 mg by mouth 3 (three) times daily as needed for muscle spasms.    [provider]    Family History Family History  Problem Relation Age of Onset  . Healthy Mother   . Hypertension Father   . Heart disease Father   . Alcohol abuse Father     Social History Social History   Tobacco Use  . Smoking status: Former Smoker    Types: Cigarettes  . Smokeless tobacco: Former Neurosurgeon    Types: Chew  Substance Use Topics  . Alcohol use: Not Currently    Comment: socially  . Drug use: Never     Allergies   Beef-derived  products and Pork-derived products   Review of Systems Review of Systems  Constitutional: Negative for fatigue and fever.  HENT: Positive for rhinorrhea. Negative for sinus pressure, sinus pain and sore throat.   Respiratory: Negative for shortness of breath.   Cardiovascular: Negative for chest pain.  Gastrointestinal: Negative for abdominal pain, diarrhea and vomiting.  Musculoskeletal: Negative for arthralgias and myalgias.  Neurological: Negative for weakness and headaches.     Physical Exam Triage Vital Signs ED Triage Vitals  Enc Vitals Group     BP 01/21/19 1002 112/79     Pulse Rate 01/21/19 1002 93     Resp 01/21/19 1002 18     Temp 01/21/19 1002 98.5 F (36.9 C)     Temp Source 01/21/19 1002 Oral     SpO2 01/21/19 1002 100 %     Weight 01/21/19 0959 170 lb (77.1 kg)     Height 01/21/19 0959 6' (1.829 m)     Head Circumference --      Peak Flow --      Pain Score 01/21/19 0959 0     Pain Loc --      Pain Edu? --      Excl. in GC? --    No data found.  Updated Vital Signs BP 112/79 (BP Location: Right  Arm)   Pulse 93   Temp 98.5 F (36.9 C) (Oral)   Resp 18   Ht 6' (1.829 m)   Wt 170 lb (77.1 kg)   SpO2 100%   BMI 23.06 kg/m       Physical Exam Vitals signs and nursing note reviewed.  Constitutional:      General: He is not in acute distress.    Appearance: Normal appearance. He is normal weight. He is not ill-appearing or toxic-appearing.  HENT:     Head: Normocephalic and atraumatic.     Nose: Rhinorrhea (trace clear drainage) present.     Mouth/Throat:     Mouth: Mucous membranes are moist.     Pharynx: Oropharynx is clear. No posterior oropharyngeal erythema.  Eyes:     General: No scleral icterus.       Right eye: No discharge.        Left eye: No discharge.     Conjunctiva/sclera: Conjunctivae normal.  Neck:     Musculoskeletal: Neck supple.  Cardiovascular:     Rate and Rhythm: Normal rate and regular rhythm.     Pulses: Normal  pulses.     Heart sounds: No murmur.  Pulmonary:     Effort: Pulmonary effort is normal. No respiratory distress.     Breath sounds: Normal breath sounds. No wheezing or rhonchi.  Lymphadenopathy:     Cervical: No cervical adenopathy.  Skin:    General: Skin is warm and dry.     Findings: No rash.  Neurological:     Mental Status: He is alert.  Psychiatric:        Mood and Affect: Mood normal.        Behavior: Behavior normal.        Thought Content: Thought content normal.      UC Treatments / Results  Labs (all labs ordered are listed, but only abnormal results are displayed) Labs Reviewed - No data to display  EKG   Radiology No results found.  Procedures Procedures (including critical care time)  Medications Ordered in UC Medications - No data to display  Initial Impression / Assessment and Plan / UC Course  I have reviewed the triage vital signs and the nursing notes.  Pertinent labs & imaging results that were available during my care of the patient were reviewed by me and considered in my medical decision making (see chart for details).  Patient presenting with viral URI symptoms. Encouraged COVID testing but he declined. Advised him to stay home since he is not getting test and rest. Increase fluids. May take prescribed benzonatate for cough. Advised to follow up with us for testing if any other possible COVID symptoms develop. ER precautions given.  Final Clinical Impressions(s) / UC Diagnoses   Final diagnoses:  Acute upper respiratory infection  Cough     Discharge Instructions     URI/COLD SYMPTOMS: Your exam today is consistent with a viral illness. Antibiotics are not indicated at this time. Use medications as directed, including cough syrup or tessalon perles for cough, nasal saline, and decongestants. Your symptoms should improve over the next few days and resolve within 7-10 days. Increase rest and fluids. F/u if symptoms worsen or predominate  such as sore throat, ear pain, productive cough, shortness of breath, or if you develop high fevers or worsening fatigue over the next several days.   AS I HAVE EXPLAINED COIVID 19 INFECTION CANNOT BE DEFINITIVELY RULED OUT WITHOUT A TEST. YOU HAVE DECLINED TESTING  TODAY. PLEASE STAY HOME IF YOU CAN FOR THE NEXT WEEK OTHERWISE SOCIAL DISTANCE, SANITIZE, [place] HANDS, WEAR MASK AND GLOVES IF POSSIBLE AND FOLLOW UP IF FEVER, WORSENING COUGH, BREATHING ISSUE OR LOSS OF SMELL/TASTE.    ED Prescriptions    Medication Sig Dispense Auth. Provider   benzonatate (TESSALON) 100 MG capsule Take 2 capsules (200 mg total) by mouth 3 (three) times daily as needed for up to 7 days for cough. 21 capsule Danton Clap, PA-C     PDMP not reviewed this encounter.   Danton Clap, PA-C 01/21/19 1040

## 2019-01-21 NOTE — ED Triage Notes (Signed)
Pt c/o cough, and nasal congestion. Started about 3 days ago. Denies fever, body aches. Declines COVID testing.

## 2019-07-17 ENCOUNTER — Ambulatory Visit
Admission: EM | Admit: 2019-07-17 | Discharge: 2019-07-17 | Disposition: A | Discharge status: Home / Self Care | Payer: 59 | Attending: Family Medicine | Admitting: Family Medicine

## 2019-07-17 ENCOUNTER — Other Ambulatory Visit: Payer: Self-pay

## 2019-07-17 DIAGNOSIS — L255 Unspecified contact dermatitis due to plants, except food: Secondary | ICD-10-CM | POA: Diagnosis not present

## 2019-07-17 MED ORDER — MUPIROCIN 2 % EX OINT: 1.0000 "application " | TOPICAL_OINTMENT | Freq: Two times a day (BID) | CUTANEOUS | 0 refills | Status: AC

## 2019-07-17 MED ORDER — PREDNISONE 10 MG PO TABS: ORAL_TABLET | ORAL | 0 refills | Status: DC

## 2019-07-17 NOTE — ED Provider Notes (Signed)
[facility]    CSN: [id number] Arrival date & time: 07/17/19  0803      History   Chief Complaint Chief Complaint  Patient presents with  . Rash   HPI  29 year old male presents with rash.   Patient has a rash to the left side of his nose.  He states that he has recently been working outside.  Patient states that he is concerned that it may be poison ivy.  It has been raised and blisterlike and is now draining.  It has some crusting on it currently.  He has no other areas affected.  No medications or interventions tried.  No fever.  No other associated symptoms.  No other complaints.  Past Medical History:  Diagnosis Date  . Allergy to alpha-gal    Past Surgical History:  Procedure Laterality Date  . NO PAST SURGERIES     Home Medications    Prior to Admission medications   Medication Sig Start Date End Date Taking? Authorizing Provider  cetirizine (ZYRTEC) 10 MG tablet Take 10 mg by mouth daily.   Yes [provider]  naproxen sodium (ALEVE) 220 MG tablet Take 220 mg by mouth.   Yes [provider]  mupirocin ointment (BACTROBAN) 2 % Apply 1 application topically 2 (two) times daily for 7 days. 07/17/19 07/24/19  Tommie Sams, DO  predniSONE (DELTASONE) 10 MG tablet 50 mg daily x 2 days, then 40 mg daily x 2 days, then 30 mg daily x 2 days, then 20 mg daily x 2 days, then 10 mg daily x 2 days. 07/17/19   Tommie Sams, DO    Family History Family History  Problem Relation Age of Onset  . Healthy Mother   . Hypertension Father   . Heart disease Father   . Alcohol abuse Father     Social History Social History   Tobacco Use  . Smoking status: Former Smoker    Types: Cigarettes  . Smokeless tobacco: Former Neurosurgeon    Types: Chew  Substance Use Topics  . Alcohol use: Not Currently    Comment: socially  . Drug use: Never     Allergies   Beef-derived products and Pork-derived products   Review of Systems Review of Systems    Constitutional: Negative.   Skin: Positive for rash.   Physical Exam Triage Vital Signs ED Triage Vitals [07/17/19 0815]  Enc Vitals Group     BP 128/81     Pulse Rate 79     Resp 16     Temp 98 F (36.7 C)     Temp src      SpO2 100 %     Weight      Height      Head Circumference      Peak Flow      Pain Score 0     Pain Loc      Pain Edu?      Excl. in GC?    Updated Vital Signs BP 128/81   Pulse 79   Temp 98 F (36.7 C)   Resp 16   SpO2 100%   Visual Acuity Right Eye Distance:   Left Eye Distance:   Bilateral Distance:    Right Eye Near:   Left Eye Near:    Bilateral Near:     Physical Exam Vitals and nursing note reviewed.  Constitutional:      General: He is not in acute distress.  Appearance: Normal appearance. He is not ill-appearing.  HENT:     Head: Normocephalic and atraumatic.     Nose:     Comments: Left side of the nose with raised, erythematous rash.  Mild crusting. Eyes:     General:        Right eye: No discharge.        Left eye: No discharge.     Conjunctiva/sclera: Conjunctivae normal.  Pulmonary:     Effort: Pulmonary effort is normal. No respiratory distress.  Neurological:     Mental Status: He is alert.  Psychiatric:        Mood and Affect: Mood normal.        Behavior: Behavior normal.    UC Treatments / Results  Labs (all labs ordered are listed, but only abnormal results are displayed) Labs Reviewed - No data to display  EKG   Radiology No results found.  Procedures Procedures (including critical care time)  Medications Ordered in UC Medications - No data to display  Initial Impression / Assessment and Plan / UC Course  I have reviewed the triage vital signs and the nursing notes.  Pertinent labs & imaging results that were available during my care of the patient were reviewed by me and considered in my medical decision making (see chart for details).    29 year old male presents with dermatitis  likely secondary to poison oak or poison ivy.  Treating with prednisone and also placing on mupirocin to cover for potential bacterial superinfection.  Final Clinical Impressions(s) / UC Diagnoses   Final diagnoses:  Dermatitis due to plants, including poison ivy, sumac, and oak     Discharge Instructions     Medication as prescribed.   Take care  Dr. Lacinda Axon    ED Prescriptions    Medication Sig Dispense Auth. Provider   predniSONE (DELTASONE) 10 MG tablet 50 mg daily x 2 days, then 40 mg daily x 2 days, then 30 mg daily x 2 days, then 20 mg daily x 2 days, then 10 mg daily x 2 days. 30 tablet Vaniya Augspurger G, DO   mupirocin ointment (BACTROBAN) 2 % Apply 1 application topically 2 (two) times daily for 7 days. 22 g Coral Spikes, DO     PDMP not reviewed this encounter.   Coral Spikes, DO 07/17/19 1031

## 2019-07-17 NOTE — ED Triage Notes (Signed)
Pt has rash to nose since Monday after working outside, concerned about poison ivy.

## 2019-07-17 NOTE — Discharge Instructions (Signed)
Medication as prescribed.  Take care  Dr. Caoilainn Sacks  

## 2019-09-04 ENCOUNTER — Ambulatory Visit: Payer: 59 | Attending: Otolaryngology

## 2019-09-04 DIAGNOSIS — R0683 Snoring: Secondary | ICD-10-CM | POA: Insufficient documentation

## 2019-09-04 DIAGNOSIS — G4733 Obstructive sleep apnea (adult) (pediatric): Secondary | ICD-10-CM | POA: Insufficient documentation

## 2019-09-05 ENCOUNTER — Other Ambulatory Visit: Payer: Self-pay

## 2019-10-29 IMAGING — CR DG SHOULDER 2+V*R*
3 series · 3 of 3 positions shown · non-contrast
Comparison: None

CLINICAL DATA: Right shoulder pain which began last week.

EXAM:
RIGHT SHOULDER - 2+ VIEW

[shoulder y view]
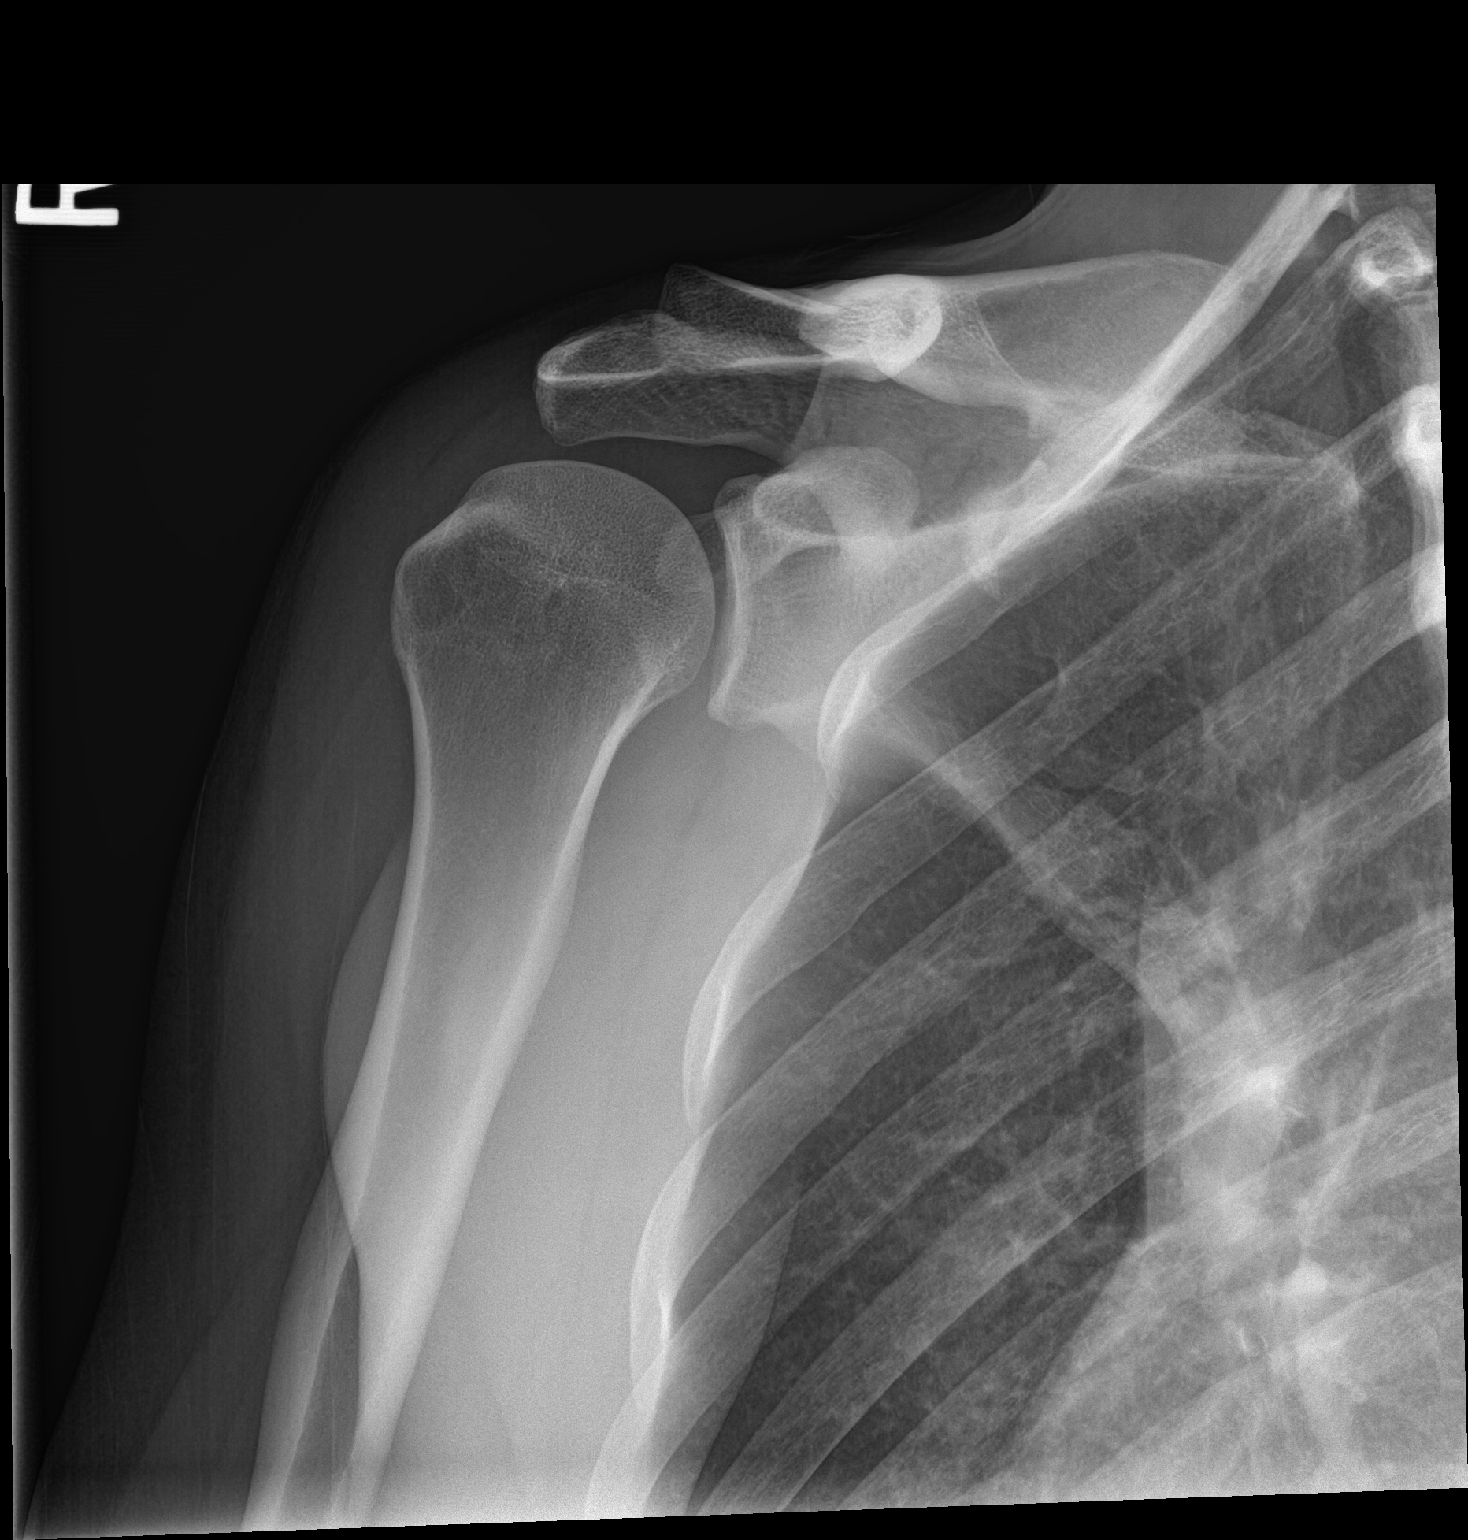

[shoulder axial (1 of 2)]
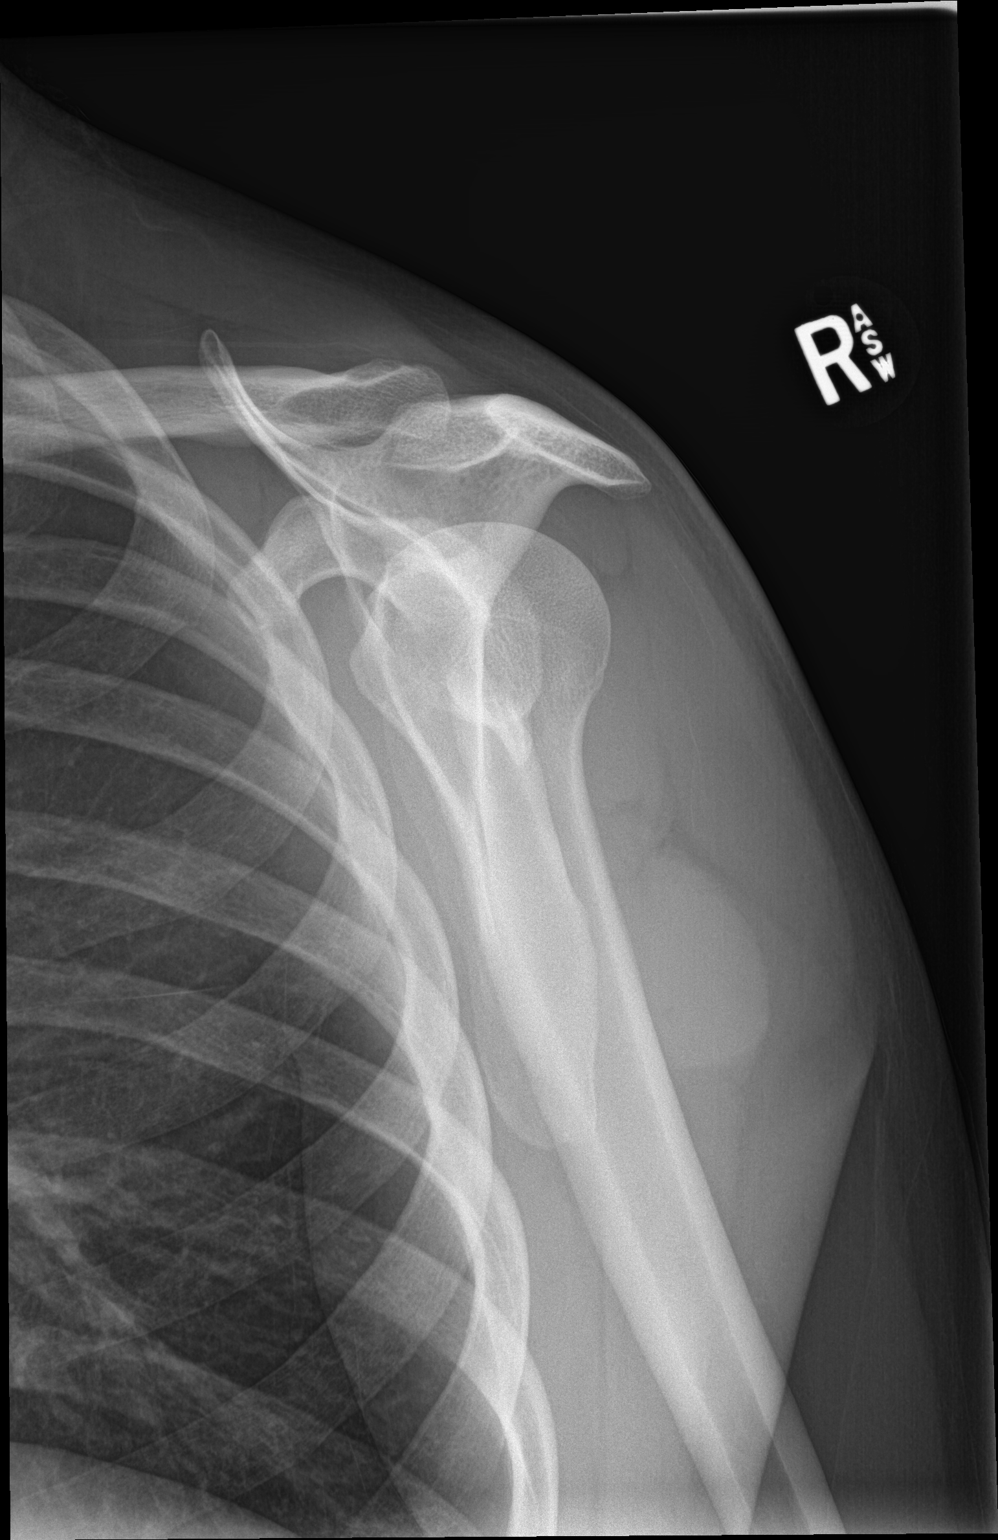

[shoulder axial (2 of 2)]
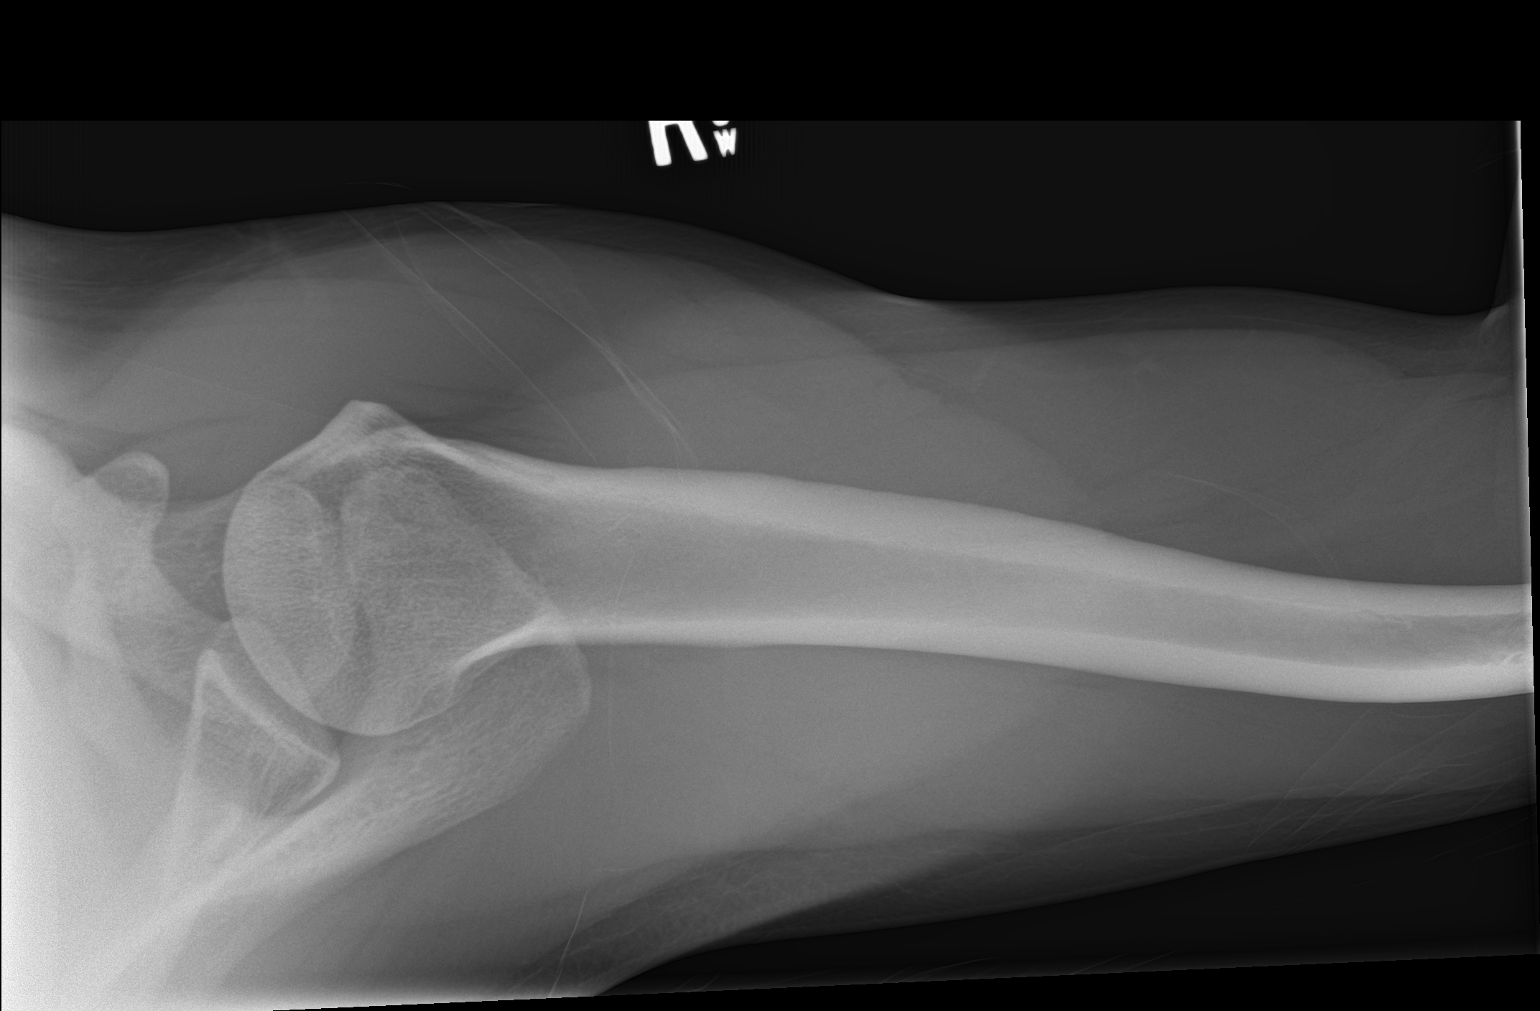

[3 of 3 positions shown; findings below may reference images not displayed]

FINDINGS: There is no evidence of fracture or dislocation. There is no
evidence of arthropathy or other focal bone abnormality. Soft
tissues are unremarkable.
IMPRESSION: Negative.

## 2020-04-28 ENCOUNTER — Other Ambulatory Visit: Payer: Self-pay

## 2020-04-28 ENCOUNTER — Emergency Department
Admission: EM | Admit: 2020-04-28 | Discharge: 2020-04-28 | Disposition: A | Discharge status: Home / Self Care | Payer: 59 | Attending: Student in an Organized Health Care Education/Training Program | Admitting: Student in an Organized Health Care Education/Training Program

## 2020-04-28 ENCOUNTER — Encounter: Payer: Self-pay | Admitting: Emergency Medicine

## 2020-04-28 DIAGNOSIS — M5412 Radiculopathy, cervical region: Secondary | ICD-10-CM | POA: Diagnosis present

## 2020-04-28 DIAGNOSIS — Z87891 Personal history of nicotine dependence: Secondary | ICD-10-CM | POA: Diagnosis not present

## 2020-04-28 NOTE — ED Triage Notes (Addendum)
Patient ambulatory to triage with steady gait, without difficulty or distress noted; pt reports neck pain that radiates down into rt shoulder and arm "for awhile" with no specific injury; pain with ROM of rt arm or neck; has been dx with pinched nerve and rx prednisone & gabapentin recently at emerg orth[facility] and also seen by chiropractor; awaiting MRI to be scheduled

## 2020-04-28 NOTE — Discharge Instructions (Addendum)
Follow-up with your treating doctor.

## 2020-04-28 NOTE — ED Provider Notes (Signed)
[facility] Emergency Department Provider Note   ____________________________________________   None    (approximate)  I have reviewed the triage vital signs and the nursing notes.   HISTORY  Chief Complaint Neck Pain    HPI Hector Cox is a 30 y.o. male patient presents for cervical radiculopathy to the right upper extremity.  Patient was seen by [facility] yesterday.  Patient states x-rays and given prescription for prednisone and gabapentin.  Patient stated medicine is not helped.  Patient state he is being consulted for an MRI but decided come to the emergency room to have emergent MRI.  Patient rates pain as a 10/10.  Patient state he has seen a chronic fracture in the past following transient relief.         Past Medical History:  Diagnosis Date  . Allergy to alpha-gal     There are no problems to display for this patient.   Past Surgical History:  Procedure Laterality Date  . NO PAST SURGERIES      Prior to Admission medications   Medication Sig Start Date End Date Taking? Authorizing Provider  cetirizine (ZYRTEC) 10 MG tablet Take 10 mg by mouth daily.    [provider]  naproxen sodium (ALEVE) 220 MG tablet Take 220 mg by mouth.    [provider]  predniSONE (DELTASONE) 10 MG tablet 50 mg daily x 2 days, then 40 mg daily x 2 days, then 30 mg daily x 2 days, then 20 mg daily x 2 days, then 10 mg daily x 2 days. 07/17/19   Tommie Sams, DO    Allergies Beef-derived products and Pork-derived products  Family History  Problem Relation Age of Onset  . Healthy Mother   . Hypertension Father   . Heart disease Father   . Alcohol abuse Father     Social History Social History   Tobacco Use  . Smoking status: Former Smoker    Types: Cigarettes  . Smokeless tobacco: Former Neurosurgeon    Types: Engineer, drilling  . Vaping Use: Never used  Substance Use Topics  . Alcohol use: Not Currently    Comment:  socially  . Drug use: Never    Review of Systems Constitutional: No fever/chills Eyes: No visual changes. ENT: No sore throat. Cardiovascular: Denies chest pain. Respiratory: Denies shortness of breath. Gastrointestinal: No abdominal pain.  No nausea, no vomiting.  No diarrhea.  No constipation. Genitourinary: Negative for dysuria. Musculoskeletal: Negative for back pain. Skin: Negative for rash. Neurological: Cervical radicular pain to the right upper extremity.   Allergic/Immunilogical: Beef and pork products.  ____________________________________________   PHYSICAL EXAM:  VITAL SIGNS: ED Triage Vitals [04/28/20 0454]  Enc Vitals Group     BP (!) 142/100     Pulse Rate 88     Resp 18     Temp 97.7 F (36.5 C)     Temp Source Oral     SpO2 100 %     Weight 150 lb (68 kg)     Height 6' (1.829 m)     Head Circumference      Peak Flow      Pain Score 10     Pain Loc      Pain Edu?      Excl. in GC?    Constitutional: Alert and oriented. Well appearing and in no acute distress.  Anxious. Neck: No stridor.  No cervical spine tenderness to palpation.  Patient  has full equal range of motion of the cervical spine. Cardiovascular: Normal rate, regular rhythm. Grossly normal heart sounds.  Good peripheral circulation.  Elevated diastolic blood pressure. Respiratory: Normal respiratory effort.  No retractions. Lungs CTAB. Musculoskeletal: No obvious deformities to the right upper extremity. Neurologic:  Normal speech and language. No gross focal neurologic deficits are appreciated. No gait instability. Skin:  Skin is warm, dry and intact. No rash noted.  No ecchymosis or erythema. Psychiatric: Mood and affect are normal. Speech and behavior are normal.  ____________________________________________   LABS (all labs ordered are listed, but only abnormal results are displayed)  Labs Reviewed - No data to  display ____________________________________________  EKG   ____________________________________________  RADIOLOGY I, Joni Reining, personally viewed and evaluated these images (plain radiographs) as part of my medical decision making, as well as reviewing the written report by the radiologist.  ED MD interpretation: Official radiology report(s): No results found.  ____________________________________________   PROCEDURES  Procedure(s) performed (including Critical Care):  Procedures   ____________________________________________   INITIAL IMPRESSION / ASSESSMENT AND PLAN / ED COURSE  As part of my medical decision making, I reviewed the following data within the electronic MEDICAL RECORD NUMBER         Patient presents with cervical radiculopathy to the right upper extremity.  Patient is requesting emergent MRI.  Discussed with patient rationale for not doing emergent MRI.  Patient's refused pain medication and states that he believes that the orthopedic department will be open soon and will follow up with clinic this morning.      ____________________________________________   FINAL CLINICAL IMPRESSION(S) / ED DIAGNOSES  Final diagnoses:  Cervical radiculopathy     ED Discharge Orders    None      *Please note:  Hector Cox was evaluated in Emergency Department on 04/28/2020 for the symptoms described in the history of present illness. He was evaluated in the context of the global COVID-19 pandemic, which necessitated consideration that the patient might be at risk for infection with the SARS-CoV-2 virus that causes COVID-19. Institutional protocols and algorithms that pertain to the evaluation of patients at risk for COVID-19 are in a state of rapid change based on information released by regulatory bodies including the CDC and federal and state organizations. These policies and algorithms were followed during the patient's care in the ED.  Some ED  evaluations and interventions may be delayed as a result of limited staffing during and the pandemic.*   Note:  This document was prepared using Dragon voice recognition software and may include unintentional dictation errors.    Joni Reining, PA-C 04/28/20 9798    Willy Eddy, MD 04/28/20 1515

## 2020-07-14 ENCOUNTER — Other Ambulatory Visit: Payer: Self-pay

## 2020-07-14 ENCOUNTER — Ambulatory Visit
Admission: EM | Admit: 2020-07-14 | Discharge: 2020-07-14 | Disposition: A | Discharge status: Home / Self Care | Payer: 59 | Attending: Family Medicine | Admitting: Family Medicine

## 2020-07-14 ENCOUNTER — Ambulatory Visit (INDEPENDENT_AMBULATORY_CARE_PROVIDER_SITE_OTHER): Payer: 59

## 2020-07-14 DIAGNOSIS — M79604 Pain in right leg: Secondary | ICD-10-CM | POA: Diagnosis not present

## 2020-07-14 DIAGNOSIS — S8011XA Contusion of right lower leg, initial encounter: Secondary | ICD-10-CM | POA: Diagnosis not present
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
## 2020-07-14 NOTE — Discharge Instructions (Signed)
Rest, ice, elevation.  Take care  Dr. Adriana Simas
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
## 2020-07-21 ENCOUNTER — Other Ambulatory Visit: Payer: Self-pay | Admitting: Physician Assistant

## 2020-07-21 DIAGNOSIS — S8011XA Contusion of right lower leg, initial encounter: Secondary | ICD-10-CM

## 2020-07-22 ENCOUNTER — Other Ambulatory Visit: Payer: Self-pay

## 2020-07-22 ENCOUNTER — Ambulatory Visit
Admission: RE | Admit: 2020-07-22 | Discharge: 2020-07-22 | Disposition: A | Discharge status: Home / Self Care | Payer: 59 | Source: Ambulatory Visit | Attending: Physician Assistant | Admitting: Physician Assistant

## 2020-07-22 DIAGNOSIS — S8011XA Contusion of right lower leg, initial encounter: Secondary | ICD-10-CM | POA: Diagnosis present

## 2020-09-07 ENCOUNTER — Ambulatory Visit: Payer: Self-pay | Admitting: Orthopedic Surgery

## 2020-09-08 NOTE — Pre-Procedure Instructions (Signed)
Surgical Instructions    Your procedure is scheduled on Friday July 15th .  Report to [facility] Main Entrance "A" at 05:30 A.M., then check in with the Admitting office.  Call this number if you have problems the morning of surgery:  [phone]   If you have any questions prior to your surgery date call [phone]: Open Monday-Friday 8am-4pm    Remember:  Do not eat or drink after midnight the night before your surgery    Take these medicines the morning of surgery with A SIP OF WATER   NONE  As of today, STOP taking any Aspirin (unless otherwise instructed by your surgeon) Aleve, Naproxen, Ibuprofen, Motrin, Advil, Goody's, BC's, all herbal medications, fish oil, and all vitamins.                     Do NOT Smoke (Tobacco/Vaping) or drink Alcohol 24 hours prior to your procedure.  If you use a CPAP at night, you may bring all equipment for your overnight stay.   Contacts, glasses, piercing's, hearing aid's, dentures or partials may not be worn into surgery, please bring cases for these belongings.    For patients admitted to the hospital, discharge time will be determined by your treatment team.   Patients discharged the day of surgery will not be allowed to drive home, and someone needs to stay with them for 24 hours.  ONLY 1 SUPPORT PERSON MAY BE PRESENT WHILE YOU ARE IN SURGERY. IF YOU ARE TO BE ADMITTED ONCE YOU ARE IN YOUR ROOM YOU WILL BE ALLOWED TWO (2) VISITORS.  Minor children may have two parents present. Special consideration for safety and communication needs will be reviewed on a case by case basis.   Special instructions:   [place]- Preparing For Surgery  Before surgery, you can play an important role. Because skin is not sterile, your skin needs to be as free of germs as possible. You can reduce the number of germs on your skin by washing with CHG (chlorahexidine gluconate) Soap before surgery.  CHG is an antiseptic cleaner which kills germs and bonds  with the skin to continue killing germs even after washing.    Oral Hygiene is also important to reduce your risk of infection.  Remember - BRUSH YOUR TEETH THE MORNING OF SURGERY WITH YOUR REGULAR TOOTHPASTE  Please do not use if you have an allergy to CHG or antibacterial soaps. If your skin becomes reddened/irritated stop using the CHG.  Do not shave (including legs and underarms) for at least 48 hours prior to first CHG shower. It is OK to shave your face.  Please follow these instructions carefully.   Shower the NIGHT BEFORE SURGERY and the MORNING OF SURGERY  If you chose to wash your hair, wash your hair first as usual with your normal shampoo.  After you shampoo, rinse your hair and body thoroughly to remove the shampoo.  Use CHG Soap as you would any other liquid soap. You can apply CHG directly to the skin and wash gently with a scrungie or a clean washcloth.   Apply the CHG Soap to your body ONLY FROM THE NECK DOWN.  Do not use on open wounds or open sores. Avoid contact with your eyes, ears, mouth and genitals (private parts). Wash Face and genitals (private parts)  with your normal soap.   Wash thoroughly, paying special attention to the area where your surgery will be performed.  Thoroughly rinse your body with warm  water from the neck down.  DO NOT shower/wash with your normal soap after using and rinsing off the CHG Soap.  Pat yourself dry with a CLEAN TOWEL.  Wear CLEAN PAJAMAS to bed the night before surgery  Place CLEAN SHEETS on your bed the night before your surgery  DO NOT SLEEP WITH PETS.   Day of Surgery: Shower with CHG soap. Do not wear jewelry, make up, nail polish, gel polish, artificial nails, or any other type of covering on natural nails including finger and toenails. If patients have artificial nails, gel coating, etc. that need to be removed by a nail salon please have this removed prior to surgery. Surgery may need to be canceled/delayed if the  surgeon/ anesthesia feels like the patient is unable to be adequately monitored. Do not wear lotions, powders, perfumes/colognes, or deodorant. Do not shave 48 hours prior to surgery.  Men may shave face and neck. Do not bring valuables to the hospital. [facility] is not responsible for any belongings or valuables. Wear Clean/Comfortable clothing the morning of surgery Remember to brush your teeth WITH YOUR REGULAR TOOTHPASTE.   Please read over the following fact sheets that you were given.

## 2020-09-09 ENCOUNTER — Other Ambulatory Visit: Payer: Self-pay

## 2020-09-09 ENCOUNTER — Encounter (HOSPITAL_COMMUNITY): Payer: Self-pay

## 2020-09-09 ENCOUNTER — Ambulatory Visit: Payer: Self-pay | Admitting: Orthopedic Surgery

## 2020-09-09 ENCOUNTER — Encounter (HOSPITAL_COMMUNITY)
Admission: RE | Admit: 2020-09-09 | Discharge: 2020-09-09 | Disposition: A | Discharge status: Home / Self Care | Payer: 59 | Source: Ambulatory Visit | Attending: Orthopedic Surgery | Admitting: Orthopedic Surgery

## 2020-09-09 ENCOUNTER — Ambulatory Visit (HOSPITAL_COMMUNITY)
Admission: RE | Admit: 2020-09-09 | Discharge: 2020-09-09 | Disposition: A | Discharge status: Home / Self Care | Payer: 59 | Source: Ambulatory Visit | Attending: Orthopedic Surgery | Admitting: Orthopedic Surgery

## 2020-09-09 DIAGNOSIS — Z01818 Encounter for other preprocedural examination: Secondary | ICD-10-CM | POA: Insufficient documentation

## 2020-09-09 HISTORY — DX: Other specified health status: Z78.9

## 2020-09-09 LAB — BASIC METABOLIC PANEL
Anion gap: 8 (ref 5–15)
BUN: 9 mg/dL (ref 6–20)
CO2: 26 mmol/L (ref 22–32)
Calcium: 9.2 mg/dL (ref 8.9–10.3)
Chloride: 102 mmol/L (ref 98–111)
Creatinine, Ser: 0.97 mg/dL (ref 0.61–1.24)
GFR, Estimated: >60 mL/min (ref >60)
Glucose, Bld: 98 mg/dL (ref 70–99)
Potassium: 4.4 mmol/L (ref 3.5–5.1)
Sodium: 136 mmol/L (ref 135–145)

## 2020-09-09 LAB — CBC
HCT: 44.2 % (ref 39.0–52.0)
Hemoglobin: 15.1 g/dL (ref 13.0–17.0)
MCH: 30.6 pg (ref 26.0–34.0)
MCHC: 34.2 g/dL (ref 30.0–36.0)
MCV: 89.7 fL (ref 80.0–100.0)
Platelets: 246 10*3/uL (ref 150–400)
RBC: 4.93 MIL/uL (ref 4.22–5.81)
RDW: 11.5 % (ref 11.5–15.5)
WBC: 3.9 10*3/uL — ABNORMAL LOW (ref 4.0–10.5)
nRBC: 0 % (ref 0.0–0.2)

## 2020-09-09 LAB — URINALYSIS, ROUTINE W REFLEX MICROSCOPIC
Bilirubin Urine: NEGATIVE
Glucose, UA: NEGATIVE mg/dL
Hgb urine dipstick: NEGATIVE
Ketones, ur: NEGATIVE mg/dL
Leukocytes,Ua: NEGATIVE
Nitrite: NEGATIVE
Protein, ur: NEGATIVE mg/dL
Specific Gravity, Urine: <1.005 — ABNORMAL LOW (ref 1.005–1.030)
pH: 6.5 (ref 5.0–8.0)

## 2020-09-09 LAB — APTT: aPTT: 30 seconds (ref 24–36)

## 2020-09-09 LAB — SURGICAL PCR SCREEN
MRSA, PCR: NEGATIVE
Staphylococcus aureus: POSITIVE — AB

## 2020-09-09 LAB — PROTIME-INR
INR: 1.1 (ref 0.8–1.2)
Prothrombin Time: 14.1 seconds (ref 11.4–15.2)

## 2020-09-09 NOTE — H&P (Signed)
Subjective:   Hector Cox is a pleasant 30 yo male who presented on 06/11/2020 as a second opinion with the CC of pain radiating into the right scapula and right arm. We have discussed treatment options and risks/benefits of surgical intervention and patient has elected to move forward with surgical intervention. He is scheduled for TDR C6-7 at [facility] on 09/10/20 with Dr. Shon Baton.  Past Medical History:  Diagnosis Date   Allergy to alpha-gal    Medical history non-contributory     Past Surgical History:  Procedure Laterality Date   WISDOM TOOTH EXTRACTION      Current Outpatient Medications  Medication Sig Dispense Refill Last Dose   naproxen sodium (ALEVE) 220 MG tablet Take 220 mg by mouth.      No current facility-administered medications for this visit.   Allergies  Allergen Reactions   Beef-Derived Products Other (See Comments)    alpha gal syndrome   Pork-Derived Products Other (See Comments)    alpha gal syndrome    Social History   Tobacco Use   Smoking status: Former    Types: Cigarettes    Quit date: 08/2018    Years since quitting: 2.0   Smokeless tobacco: Former    Types: Chew    Quit date: 09/02/2020  Substance Use Topics   Alcohol use: Yes    Comment: socially    Family History  Problem Relation Age of Onset   Healthy Mother    Hypertension Father    Heart disease Father    Alcohol abuse Father     Review of Systems Pertinent items are noted in HPI.  Objective:   Vitals: Ht: 6 ft 08/31/2020 03:24 pm BP: 130/80 08/31/2020 03:27 pm Pulse: 86 bpm 08/31/2020 03:26 pm  Clinical exam: Hector Cox is a pleasant individual, who appears younger than their stated age. He is alert and orientated 3. No shortness of breath, chest pain.   Heart: Regular rate and rhythm no rubs, murmurs, or gallops  Lungs: Clear to auscultation bilaterally  Abdomen is soft and non-tender, negative loss of bowel and bladder control, no rebound tenderness. Bowel sounds 4  Negative:  skin lesions abrasions contusions    Peripheral pulses: 2+ radial artery pulses bilaterally. Compartment soft and nontender.    Gait pattern: Normal    Assistive devices: None      Neuro: 3+/5 right tricep strength. Positive numbness and dysesthesias in the right C7 dermatome. Positive Spurling sign with reproduction of radicular arm pain in the right side. Remainder of the upper extremity strength is 5/5. Negative Hoffman test. Symmetrical 1+ deep tendon reflexes throughout the upper extremity.    Musculoskeletal: Moderate neck pain with palpation and range of motion. No shoulder, elbow, wrist pain with isolated joint range of motion    X-rays of the cervical spine were reviewed. These include AP/lateral/flexion/extension: Patient has maintained range of motion of the cervical spine. There is slight decrease in resting lordosis in the lateral x-ray. No fracture, no significant facet arthrosis.    Cervical MRI: completed on 05/18/20: C6-7 large right disc herniation posterior lateral to right with compression of the right C7 nerve root. Central and left paracentral disc protrusion at C5-6. No cord signal changes. Mild reversal of normal cervical lordosis centered at the C6-7 level.  Assessment:   Hector Cox returns today for follow-up. He states that he is now ready to move forward with surgery. His clinical exam is unchanged. He continues to have weakness and pain in the right C7 distribution. He has  no left radicular arm symptoms. We have again discussed treatment options which would be addressing the main pathology at C6-7 or moving forward with addressing is asymptomatic disc protrusion at C5-6 to the left. At this point after reviewing the pros and cons of single versus 2 level procedure he is elected move forward with just addressing C6-7 level.   Plan:   We will plan on moving forward with a cervical disc arthroplasty. I reviewed the risks and benefits and all of his questions were  addressed.Risks and benefits of surgery were discussed with the patient. These include: Infection, bleeding, death, stroke, paralysis, ongoing or worse pain, need for additional surgery, nonunion, leak of spinal fluid, adjacent segment degeneration requiring additional fusion surgery. Pseudoarthrosis (nonunion)requiring supplemental posterior fixation. Throat pain, swallowing difficulties, hoarseness or change in voice.      With respect to disc replacement: Additional risks include heterotopic ossification, inability to place the disc due to technical issues requiring bailout to a fusion procedure.      Treatment plan: We will attempt to get preoperative medical clearance from his urgent care provider. If her not able to think we can still move forward with surgery since he is essentially a healthy 30 year old gentleman.   I reviewed the patient's medication list with him. He has any blood thinners. No aspirin products. No anti-inflammatory medications. No vitamins or supplements. Advised him to avoid any over-the-counter NSAIDs leading up to surgery.  We have given him a soft cervical collar at today's office visit.  We have also discussed the post-operative recovery period to include: bathing/showering restrictions, wound healing, activity (and driving) restrictions, medications/pain mangement.   We have also discussed post-operative redflags to include: signs and symptoms of postoperative infection, DVT/PE.  Discharge instructions were reviewed with the patient. All patient's questions invited and answered.  Follow-up: 2 weeks postop

## 2020-09-09 NOTE — H&P (Deleted)
  The note originally documented on this encounter has been moved the the encounter in which it belongs.  

## 2020-09-09 NOTE — Progress Notes (Signed)
PCP - patient denies Cardiologist - patient denies  PPM/ICD - n/a Device Orders -  Rep Notified -   Chest x-ray - 09/09/20 EKG - n/a Stress Test - patient denies ECHO - patient denies Cardiac Cath - patient denies  Sleep Study - 09/12/2019, ruled out OSA CPAP -   Fasting Blood Sugar - n/a Checks Blood Sugar _____ times a day  Blood Thinner Instructions: n/a Aspirin Instructions: n/a  ERAS Protcol - npo after midnight per order PRE-SURGERY Ensure or G2-   COVID TEST- n/a - ambulatory surgery   Anesthesia review: yes, per order  Patient denies shortness of breath, fever, cough and chest pain at PAT appointment   All instructions explained to the patient, with a verbal understanding of the material. Patient agrees to go over the instructions while at home for a better understanding. Patient also instructed to self quarantine after being tested for COVID-19. The opportunity to ask questions was provided.

## 2020-09-09 NOTE — Anesthesia Preprocedure Evaluation (Addendum)
Anesthesia Evaluation  Patient identified by MRN, date of birth, ID band Patient awake    Reviewed: Allergy & Precautions, NPO status , Patient's Chart, lab work & pertinent test results  History of Anesthesia Complications Negative for: history of anesthetic complications  Airway Mallampati: II  TM Distance: >3 FB Neck ROM: Full    Dental no notable dental hx. (+) Dental Advisory Given   Pulmonary neg pulmonary ROS, former smoker,    Pulmonary exam normal        Cardiovascular negative cardio ROS Normal cardiovascular exam     Neuro/Psych    GI/Hepatic negative GI ROS, Neg liver ROS,   Endo/Other  negative endocrine ROS  Renal/GU negative Renal ROS     Musculoskeletal negative musculoskeletal ROS (+)   Abdominal   Peds  Hematology negative hematology ROS (+)   Anesthesia Other Findings   Reproductive/Obstetrics                           Anesthesia Physical Anesthesia Plan  ASA: 2  Anesthesia Plan: General   Post-op Pain Management:    Induction: Intravenous  PONV Risk Score and Plan: 3 and Ondansetron, Dexamethasone and Midazolam  Airway Management Planned: Oral ETT  Additional Equipment:   Intra-op Plan:   Post-operative Plan: Extubation in OR  Informed Consent: I have reviewed the patients History and Physical, chart, labs and discussed the procedure including the risks, benefits and alternatives for the proposed anesthesia with the patient or authorized representative who has indicated his/her understanding and acceptance.     Dental advisory given  Plan Discussed with: Anesthesiologist and CRNA  Anesthesia Plan Comments: (PAT note by Antionette Poles, PA-C:  )      Anesthesia Quick Evaluation

## 2020-09-10 ENCOUNTER — Ambulatory Visit (HOSPITAL_COMMUNITY)
Admission: RE | Admit: 2020-09-10 | Discharge: 2020-09-10 | Disposition: A | Discharge status: Home / Self Care | Payer: 59 | Attending: Orthopedic Surgery | Admitting: Orthopedic Surgery

## 2020-09-10 ENCOUNTER — Other Ambulatory Visit: Payer: Self-pay

## 2020-09-10 ENCOUNTER — Encounter (HOSPITAL_COMMUNITY): Payer: Self-pay | Admitting: Orthopedic Surgery

## 2020-09-10 ENCOUNTER — Ambulatory Visit (HOSPITAL_COMMUNITY): Payer: 59 | Admitting: Physician Assistant

## 2020-09-10 ENCOUNTER — Ambulatory Visit (HOSPITAL_COMMUNITY): Payer: 59 | Admitting: Anesthesiology

## 2020-09-10 ENCOUNTER — Ambulatory Visit (HOSPITAL_COMMUNITY)
Admission: RE | Disposition: A | Discharge status: Home / Self Care | Payer: Self-pay | Source: Home / Self Care | Attending: Orthopedic Surgery

## 2020-09-10 ENCOUNTER — Ambulatory Visit (HOSPITAL_COMMUNITY): Payer: 59

## 2020-09-10 DIAGNOSIS — Z87891 Personal history of nicotine dependence: Secondary | ICD-10-CM | POA: Diagnosis not present

## 2020-09-10 DIAGNOSIS — M50123 Cervical disc disorder at C6-C7 level with radiculopathy: Secondary | ICD-10-CM | POA: Insufficient documentation

## 2020-09-10 DIAGNOSIS — Z419 Encounter for procedure for purposes other than remedying health state, unspecified: Secondary | ICD-10-CM

## 2020-09-10 HISTORY — PX: CERVICAL DISC ARTHROPLASTY: SHX587

## 2020-09-10 SURGERY — CERVICAL ANTERIOR DISC ARTHROPLASTY
Anesthesia: General | Site: Spine Cervical

## 2020-09-10 MED ORDER — PROMETHAZINE HCL 25 MG/ML IJ SOLN: 6.2500 mg | INTRAMUSCULAR | Status: DC | PRN

## 2020-09-10 MED ORDER — PROPOFOL 10 MG/ML IV BOLUS
INTRAVENOUS | Status: DC | PRN
  Administered 2020-09-10: 200 mg via INTRAVENOUS

## 2020-09-10 MED ORDER — ROCURONIUM BROMIDE 10 MG/ML (PF) SYRINGE
PREFILLED_SYRINGE | INTRAVENOUS | Status: AC
  Filled 2020-09-10: qty 10

## 2020-09-10 MED ORDER — TRANEXAMIC ACID-NACL 1000-0.7 MG/100ML-% IV SOLN
INTRAVENOUS | Status: AC
  Filled 2020-09-10: qty 100

## 2020-09-10 MED ORDER — BUPIVACAINE-EPINEPHRINE 0.25% -1:200000 IJ SOLN
INTRAMUSCULAR | Status: DC | PRN
  Administered 2020-09-10: 5 mL

## 2020-09-10 MED ORDER — HEMOSTATIC AGENTS (NO CHARGE) OPTIME
TOPICAL | Status: DC | PRN
  Administered 2020-09-10: 1 via TOPICAL

## 2020-09-10 MED ORDER — ALBUMIN HUMAN 5 % IV SOLN: INTRAVENOUS | Status: DC | PRN

## 2020-09-10 MED ORDER — FENTANYL CITRATE (PF) 100 MCG/2ML IJ SOLN: 25.0000 ug | INTRAMUSCULAR | Status: DC | PRN

## 2020-09-10 MED ORDER — ONDANSETRON HCL 4 MG/2ML IJ SOLN
INTRAMUSCULAR | Status: AC
  Filled 2020-09-10: qty 2

## 2020-09-10 MED ORDER — ONDANSETRON HCL 4 MG/2ML IJ SOLN
INTRAMUSCULAR | Status: DC | PRN
  Administered 2020-09-10: 4 mg via INTRAVENOUS

## 2020-09-10 MED ORDER — THROMBIN (RECOMBINANT) 20000 UNITS EX SOLR
CUTANEOUS | Status: AC
  Filled 2020-09-10: qty 20000

## 2020-09-10 MED ORDER — DIPHENHYDRAMINE HCL 50 MG/ML IJ SOLN
INTRAMUSCULAR | Status: DC | PRN
  Administered 2020-09-10: 12.5 mg via INTRAVENOUS

## 2020-09-10 MED ORDER — MIDAZOLAM HCL 5 MG/5ML IJ SOLN
INTRAMUSCULAR | Status: DC | PRN
  Administered 2020-09-10: 2 mg via INTRAVENOUS

## 2020-09-10 MED ORDER — TRANEXAMIC ACID-NACL 1000-0.7 MG/100ML-% IV SOLN
INTRAVENOUS | Status: DC | PRN
  Administered 2020-09-10: 1000 mg via INTRAVENOUS

## 2020-09-10 MED ORDER — LIDOCAINE 2% (20 MG/ML) 5 ML SYRINGE
INTRAMUSCULAR | Status: DC | PRN
  Administered 2020-09-10: 100 mg via INTRAVENOUS

## 2020-09-10 MED ORDER — FENTANYL CITRATE (PF) 250 MCG/5ML IJ SOLN
INTRAMUSCULAR | Status: AC
  Filled 2020-09-10: qty 5

## 2020-09-10 MED ORDER — DEXAMETHASONE SODIUM PHOSPHATE 10 MG/ML IJ SOLN
INTRAMUSCULAR | Status: AC
  Filled 2020-09-10: qty 1

## 2020-09-10 MED ORDER — ROCURONIUM BROMIDE 10 MG/ML (PF) SYRINGE
PREFILLED_SYRINGE | INTRAVENOUS | Status: DC | PRN
  Administered 2020-09-10: 30 mg via INTRAVENOUS
  Administered 2020-09-10: 100 mg via INTRAVENOUS

## 2020-09-10 MED ORDER — CHLORHEXIDINE GLUCONATE 0.12 % MT SOLN
OROMUCOSAL | Status: AC
  Administered 2020-09-10: 15 mL
  Filled 2020-09-10: qty 15

## 2020-09-10 MED ORDER — 0.9 % SODIUM CHLORIDE (POUR BTL) OPTIME
TOPICAL | Status: DC | PRN
  Administered 2020-09-10: 1000 mL

## 2020-09-10 MED ORDER — DIPHENHYDRAMINE HCL 50 MG/ML IJ SOLN
INTRAMUSCULAR | Status: AC
  Filled 2020-09-10: qty 1

## 2020-09-10 MED ORDER — ACETAMINOPHEN 500 MG PO TABS
1000.0000 mg | ORAL_TABLET | Freq: Once | ORAL | Status: AC
  Administered 2020-09-10: 1000 mg via ORAL
  Filled 2020-09-10: qty 2

## 2020-09-10 MED ORDER — DEXAMETHASONE SODIUM PHOSPHATE 10 MG/ML IJ SOLN
INTRAMUSCULAR | Status: DC | PRN
  Administered 2020-09-10: 10 mg via INTRAVENOUS

## 2020-09-10 MED ORDER — CEFAZOLIN SODIUM-DEXTROSE 2-4 GM/100ML-% IV SOLN
2.0000 g | INTRAVENOUS | Status: AC
  Administered 2020-09-10: 2 g via INTRAVENOUS
  Filled 2020-09-10: qty 100

## 2020-09-10 MED ORDER — MIDAZOLAM HCL 2 MG/2ML IJ SOLN
INTRAMUSCULAR | Status: AC
  Filled 2020-09-10: qty 2

## 2020-09-10 MED ORDER — BUPIVACAINE-EPINEPHRINE (PF) 0.25% -1:200000 IJ SOLN
INTRAMUSCULAR | Status: AC
  Filled 2020-09-10: qty 30

## 2020-09-10 MED ORDER — OXYCODONE HCL 5 MG PO TABS
5.0000 mg | ORAL_TABLET | Freq: Once | ORAL | Status: AC
  Administered 2020-09-10: 5 mg via ORAL

## 2020-09-10 MED ORDER — OXYCODONE HCL 5 MG PO TABS
ORAL_TABLET | ORAL | Status: AC
  Filled 2020-09-10: qty 1

## 2020-09-10 MED ORDER — SUGAMMADEX SODIUM 200 MG/2ML IV SOLN
INTRAVENOUS | Status: DC | PRN
  Administered 2020-09-10 (×3): 50 mg via INTRAVENOUS

## 2020-09-10 MED ORDER — PROPOFOL 10 MG/ML IV BOLUS
INTRAVENOUS | Status: AC
  Filled 2020-09-10: qty 20

## 2020-09-10 MED ORDER — AMISULPRIDE (ANTIEMETIC) 5 MG/2ML IV SOLN: 10.0000 mg | Freq: Once | INTRAVENOUS | Status: DC | PRN

## 2020-09-10 MED ORDER — THROMBIN 20000 UNITS EX KIT
PACK | CUTANEOUS | Status: DC | PRN
  Administered 2020-09-10: 20000 [IU] via TOPICAL

## 2020-09-10 MED ORDER — FENTANYL CITRATE (PF) 250 MCG/5ML IJ SOLN
INTRAMUSCULAR | Status: DC | PRN
  Administered 2020-09-10 (×2): 50 ug via INTRAVENOUS
  Administered 2020-09-10: 150 ug via INTRAVENOUS

## 2020-09-10 MED ORDER — CELECOXIB 200 MG PO CAPS
200.0000 mg | ORAL_CAPSULE | Freq: Once | ORAL | Status: AC
  Administered 2020-09-10: 200 mg via ORAL
  Filled 2020-09-10: qty 1

## 2020-09-10 MED ORDER — LACTATED RINGERS IV SOLN: INTRAVENOUS | Status: DC | PRN

## 2020-09-10 MED ORDER — LIDOCAINE 2% (20 MG/ML) 5 ML SYRINGE
INTRAMUSCULAR | Status: AC
  Filled 2020-09-10: qty 5

## 2020-09-10 SURGICAL SUPPLY — 53 items
BAG COUNTER SPONGE SURGICOUNT (BAG) ×2 IMPLANT
BIT DRILL FLUTELESS (BIT) ×2 IMPLANT
BLADE CLIPPER SURG (BLADE) IMPLANT
CANISTER SUCT 3000ML PPV (MISCELLANEOUS) ×2 IMPLANT
CLSR STERI-STRIP ANTIMIC 1/2X4 (GAUZE/BANDAGES/DRESSINGS) ×2 IMPLANT
COVER MAYO STAND STRL (DRAPES) ×4 IMPLANT
COVER SURGICAL LIGHT HANDLE (MISCELLANEOUS) IMPLANT
DRAPE C-ARM 42X72 X-RAY (DRAPES) ×2 IMPLANT
DRAPE C-ARMOR (DRAPES) IMPLANT
DRAPE POUCH INSTRU U-SHP 10X18 (DRAPES) ×2 IMPLANT
DRAPE SURG 17X23 STRL (DRAPES) ×2 IMPLANT
DRAPE U-SHAPE 47X51 STRL (DRAPES) ×2 IMPLANT
DRSG OPSITE POSTOP 3X4 (GAUZE/BANDAGES/DRESSINGS) ×2 IMPLANT
DURAPREP 26ML APPLICATOR (WOUND CARE) ×2 IMPLANT
ELECT COATED BLADE 2.86 ST (ELECTRODE) ×2 IMPLANT
ELECT PENCIL ROCKER SW 15FT (MISCELLANEOUS) ×2 IMPLANT
ELECT REM PT RETURN 9FT ADLT (ELECTROSURGICAL) ×2
ELECTRODE REM PT RTRN 9FT ADLT (ELECTROSURGICAL) ×1 IMPLANT
GLOVE SURG ENC MOIS LTX SZ6.5 (GLOVE) ×2 IMPLANT
GLOVE SURG MICRO LTX SZ8.5 (GLOVE) ×2 IMPLANT
GLOVE SURG UNDER POLY LF SZ6.5 (GLOVE) ×2 IMPLANT
GLOVE SURG UNDER POLY LF SZ8.5 (GLOVE) ×2 IMPLANT
GOWN STRL REUS W/ TWL LRG LVL3 (GOWN DISPOSABLE) ×1 IMPLANT
GOWN STRL REUS W/TWL 2XL LVL3 (GOWN DISPOSABLE) ×4 IMPLANT
GOWN STRL REUS W/TWL LRG LVL3 (GOWN DISPOSABLE) ×1
IMPL CERV DISC 5X16X15 (Neuro Prosthesis/Implant) ×1 IMPLANT
IMPLANT CERV DISC 5X16X15 (Neuro Prosthesis/Implant) ×2 IMPLANT
KIT BASIN OR (CUSTOM PROCEDURE TRAY) ×2 IMPLANT
KIT TURNOVER KIT B (KITS) ×2 IMPLANT
NEEDLE SPNL 18GX3.5 QUINCKE PK (NEEDLE) ×2 IMPLANT
NS IRRIG 1000ML POUR BTL (IV SOLUTION) ×2 IMPLANT
PACK ORTHO CERVICAL (CUSTOM PROCEDURE TRAY) ×2 IMPLANT
PACK UNIVERSAL I (CUSTOM PROCEDURE TRAY) ×2 IMPLANT
PAD ARMBOARD 7.5X6 YLW CONV (MISCELLANEOUS) ×4 IMPLANT
PIN DISTRACTION MAXCESS-C 14 (PIN) ×4 IMPLANT
POSITIONER HEAD DONUT 9IN (MISCELLANEOUS) ×2 IMPLANT
RESTRAINT LIMB HOLDER UNIV (RESTRAINTS) ×2 IMPLANT
SPONGE INTESTINAL PEANUT (DISPOSABLE) ×4 IMPLANT
SPONGE SURGIFOAM ABS GEL SZ50 (HEMOSTASIS) ×2 IMPLANT
SPONGE T-LAP 18X18 ~~LOC~~+RFID (SPONGE) ×4 IMPLANT
SURGIFLO W/THROMBIN 8M KIT (HEMOSTASIS) ×2 IMPLANT
SUT BONE WAX W31G (SUTURE) ×2 IMPLANT
SUT MNCRL AB 3-0 PS2 27 (SUTURE) ×2 IMPLANT
SUT SILK 3 0 TIES 17X18 (SUTURE)
SUT SILK 3-0 18XBRD TIE BLK (SUTURE) IMPLANT
SUT VIC AB 2-0 CT1 18 (SUTURE) ×2 IMPLANT
SYR BULB IRRIG 60ML STRL (SYRINGE) ×2 IMPLANT
SYR CONTROL 10ML LL (SYRINGE) IMPLANT
TAPE CLOTH 4X10 WHT NS (GAUZE/BANDAGES/DRESSINGS) ×2 IMPLANT
TAPE UMBILICAL COTTON 1/8X30 (MISCELLANEOUS) ×2 IMPLANT
TOWEL GREEN STERILE (TOWEL DISPOSABLE) ×2 IMPLANT
TOWEL GREEN STERILE FF (TOWEL DISPOSABLE) ×2 IMPLANT
WATER STERILE IRR 1000ML POUR (IV SOLUTION) ×2 IMPLANT

## 2020-09-10 NOTE — Op Note (Signed)
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  5 x 16  BRIEF HISTORY: HASAAN RADDE II is a 30 y.o. male who presents to my office with complaints of significant neck and radicular right arm pain.  Imaging studies demonstrated a large posterior lateral to the right disc herniation at C6-7 with C7 nerve compression.  Attempts at conservative management had failed to alleviate his symptoms and so he elected to proceed with surgery.  All appropriate risks, benefits, alternatives were discussed with the patient and consent was obtained  PROCEDURE DETAILS: Patient was brought into the operating room and was properly positioned on the operating room table, and teds and SCDs were applied.  After induction with general anesthesia the patient was endotracheally intubated.  A timeout was taken to confirm all important data: Including patient, procedure, and the level.  The anterior cervical spine was then prepped and draped in a standard fashion.  Fluoroscopy was used to identify the C6-7 disc space and I marked out a transverse incision.  The incision site was then infiltrated with quarter percent Marcaine with epinephrine.  Small transverse incision was made starting from the midline and proceeding to the left.  Sharp dissection was carried out down to and through the platysma.  This is a standard Smith-Robinson approach to the cervical spine.  I sharply dissected along the medial border the sternocleidomastoid.  Identified the omohyoid muscle and retracted it to the  right.  I then bluntly dissected through the remainder of the prevertebral fascia and swept the esophagus and the trachea to the right.  Hand-held retractor was used to maintain their position.  Kitner dissectors were then used to remove the remaining prevertebral fascia and expose the anterior longitudinal ligament.  A needle was placed into the C6-7 disc space and a lateral x-ray was taken to confirm that it was at the appropriate level.  Using bipolar cautery I mobilized the longus coli muscle from the superior aspect of C6 to the inferior aspect of C7.  Caspar self-retaining retractor blades were placed underneath the longus coli muscle and the endotracheal cuff was deflated and I expanded the retractor and reinflated the cuff.  An annulotomy was performed at C6-7 with a 15 blade scalpel and I used a combination of pituitary rongeurs and curettes to remove the bulk of the disc material.  Using a Kerrison rongeur to remove the overhanging osteophyte from the inferior aspect of the C6 vertebral body.  I then used a flat double-action Leksell rongeur to level the anterior aspect of the C6-7 disc space.  Distraction pins were then placed into the body of C6 and C7.  Using AP fluoroscopy identified the midline based on the location of the spinous process and made sure that both pins were properly positioned in the middle.  I distracted the intervertebral space and maintain that with the distraction pin set.  I continued using my fine nerve hooks to remove the disc and cartilaginous endplate.  I then identified the posterior lateral to the right disc herniation.  Using a fine nerve hook I began to  gently dissect and removed the cartilaginous disc herniation.  This allowed me to develop a plane underneath the posterior longitudinal ligament.  Once I was able to create a plane between the thecal sac and the posterior longitudinal ligament I used my 1 mm Kerrison rongeur to resect the posterior longitudinal ligament as  well as the posterior osteophyte from the vertebral bodies of C6 and C7.  Once I had the left side decompressed I then was able to develop a plane underneath the disc fragment and the PLL and resect the fragment of disc as well as the PLL going to the right.  Once I had remove this the thecal sac itself began to reexpand into the space that was occupied by the disc herniation.  I was able to freely sweep behind the endplates of C6 and C7 and the uncovertebral joint.  I confirmed this using fluoroscopy.  With the discectomy/decompression complete I move forward with trialing the intervertebral spacer.  I elected used a 5 x 16 spacer.  The spacer was properly positioned and confirmed in both the AP and lateral planes.  I then used the chisel to develop my planes and remove the device.  I copiously irrigated the wound Sure all 4 of the bone chisel channels were cleared.  Once I confirmed hemostasis I then implanted the device.  Final imaging demonstrated satisfactory position within the disc space.  I then remove the distraction pins and sealed the bleeding bone holes with bone wax.  I copiously irrigated the wound with normal saline until I had clear irrigation coming out and I confirmed hemostasis by direct visualization.  The trach and esophagus were returned to midline after the retractors were removed.  Final images demonstrated satisfactory position of the implant in both the AP and lateral planes.  The wound was closed with 2-0 Vicryl suture to reapproximate the platysma, and 3-0 Monocryl for the skin.  Steri-Strips and dry dressing and a soft collar were applied.  The patient was ultimately extubated transfer the PACU without incident.  Plan will be for the patient to be discharged home from the PACU.  Appropriate instructions were provided to the patient and his wife and medications (Percocet, Robaxin, and Zofran) were electronically sent to his pharmacy.  Patient will follow-up with me in 2  weeks.    Venita Lick, MD 09/10/2020 9:50 AM

## 2020-09-10 NOTE — Transfer of Care (Signed)
Immediate Anesthesia Transfer of Care Note  Patient: Hector Cox  Procedure(s) Performed: CERVICAL ANTERIOR DISC ARTHROPLASTY CERVICAL SIX-SEVEN (Spine Cervical)  Patient Location: PACU  Anesthesia Type:General  Level of Consciousness: awake and patient cooperative  Airway & Oxygen Therapy: Patient Spontanous Breathing and Patient connected to nasal cannula oxygen  Post-op Assessment: Report given to RN and Post -op Vital signs reviewed and stable  Post vital signs: Reviewed and stable  Last Vitals:  Vitals Value Taken Time  BP 113/82 09/10/20 1006  Temp 36.3 C 09/10/20 1006  Pulse 84 09/10/20 1013  Resp 12 09/10/20 1013  SpO2 100 % 09/10/20 1013  Vitals shown include unvalidated device data.  Last Pain:  Vitals:   09/10/20 0534  TempSrc: Oral         Complications: No notable events documented.

## 2020-09-10 NOTE — Anesthesia Procedure Notes (Signed)
Procedure Name: Intubation Date/Time: 09/10/2020 7:41 AM Performed by: Adria Dill, CRNA Pre-anesthesia Checklist: Patient identified, Emergency Drugs available, Suction available and Patient being monitored Patient Re-evaluated:Patient Re-evaluated prior to induction Oxygen Delivery Method: Circle system utilized Preoxygenation: Pre-oxygenation with 100% oxygen Induction Type: IV induction Ventilation: Mask ventilation without difficulty Laryngoscope Size: Miller and 3 Grade View: Grade I Tube type: Oral Tube size: 7.5 mm Number of attempts: 1 Airway Equipment and Method: Stylet and Oral airway Placement Confirmation: ETT inserted through vocal cords under direct vision, positive ETCO2 and breath sounds checked- equal and bilateral Secured at: 21 cm Tube secured with: Tape Dental Injury: Teeth and Oropharynx as per pre-operative assessment

## 2020-09-10 NOTE — Anesthesia Postprocedure Evaluation (Signed)
Anesthesia Post Note  Patient: Hector Cox  Procedure(s) Performed: CERVICAL ANTERIOR DISC ARTHROPLASTY CERVICAL SIX-SEVEN (Spine Cervical)     Patient location during evaluation: PACU Anesthesia Type: General Level of consciousness: sedated Pain management: pain level controlled Vital Signs Assessment: post-procedure vital signs reviewed and stable Respiratory status: spontaneous breathing and respiratory function stable Cardiovascular status: stable Postop Assessment: no apparent nausea or vomiting Anesthetic complications: no   No notable events documented.  Last Vitals:  Vitals:   09/10/20 1036 09/10/20 1051  BP: 116/77 122/83  Pulse: 93 71  Resp: 13 12  Temp:  (!) 36.3 C  SpO2: 100% 100%    Last Pain:  Vitals:   09/10/20 1051  TempSrc:   PainSc: 0-No pain                 Hector Cox

## 2020-09-10 NOTE — H&P (Signed)
Addendum H&P: There is been no change to the patient's clinical exam since his last evaluation on 09/09/2020.  He continues to have significant neck and radicular right arm pain.  Plan on moving forward with a total disc arthroplasty C6-7.  All risks, benefits, alternatives to surgery were discussed with the patient and he is expressed understanding of these risks as well as willingness to move forward with surgery.

## 2020-09-10 NOTE — Discharge Instructions (Addendum)
° ° ° ° ° °Today you will be discharged from the hospital.  The purpose of the following handout is to help guide you over the next 2 weeks.  First and foremost, be sure you have a follow up appointment with Dr. Sachit Gilman 2 weeks from the time of your surgery to have your sutures removed.  Please call [facility] [phone] to schedule or confirm this appointment.   ° ° ° °Brace °You do not have to wear the collar while lying in bed or sitting in a high-backed chair, eating, sleeping or showering.  Other than these instances, you must wear the brace.  You may NOT wear the collar while driving a vehicle (see driving restrictions below).  It is advisable that you wear the collar in public places or while traveling in a car as a passenger.  Dr. Marcelene Weidemann will discuss further use of the collar at your 2 week postop visit. ° °Wound Care °You may SHOWER 5 days from the date of surgery.  Shower directly over the steri-strips.  DO NOT scrub or submerge (bath tub, swimming pool, hot tub, etc.) the area.  Pat to dry following your shower.  There is no need for additional dressings other than the steri-strips.  Allow the steri-strips to fall off on their own.  Once the strips have fallen off, you may leave the area undressed.  DO NOT apply lotion/cream/ointment to the area.  The wound must remain dry at all times other than while showering.  Dr. Zidane Renner or his staff will remove your stiches at your first postop visit and give you additional instructions regarding wound care at that time.  ° °Activity °NO DRIVING FOR 2 WEEKS.  No lifting over 5 pounds (approximately a gallon of milk).  No bending, stooping, squatting or twisting.  No overhead activities.  We encourage you to walk (short distances and often throughout the day) as you can tolerate.  A good rule of thumb is to get up and move once or twice every hour.  You may go up and down stairs carefully.  As you continue to recover, Dr. Chrisanna Mishra will address and  adjust restrictions to your activities until no further restrictions are needed.  However, until your first postop visit, when Dr. Tranisha Tissue can assess your recovery, you are to follow these instructions.  At the end of this document is a tentative outline of activities for up to 1 year.   ° ° ° ° °Medication °You will be discharged from the hospital with medication for pain, spasm, nausea and constipation.  You will be given enough medication to last until your first postop visit in 2 weeks.  Medications WILL NOT BE REFILLED EARLY; therefore, you are to take the medications only as directed.  If you have been given multiple prescriptions, please leave them with your pharmacy.  They can keep them on file for when you need them.  Medications that are lost or stolen WILL NOT be replaced.  We will address the need for continuing certain medications on an individual basis during your postop visit.  We ask that you avoid over the counter anti-inflammatory medications (Advil, Aleve, Motrin) for 3 months.   ° °What you can expect following neck surgery... °It is not uncommon to experience a sore throat or difficulty swallowing following neck surgery.  Cold liquids and soft foods are helpful in soothing this discomfort.  There is no specific diet that you are to follow after surgery, however, there are a   you should keep in mind to avoid unneeded discomfort.  Take small bites and eat slowly.  Chew your food thoroughly before swallowing.   It is not uncommon to experience incisional soreness or pain in the back of the neck, shoulders or between the shoulder blades.  These symptoms will slowly begin to resolve as you continue to recover, however, they can last for a few weeks.    It is not uncommon to experience INTERMITTENT arm pain following surgery.  This pain can mimic the arm pain you had prior to surgery.  As long as the pain resolves on its own and is not constant, there is no need to become alarmed.   When To  Call If you experience fever >101F, loss of bowel or bladder control, painful swelling in the lower extremities, constant (unresolving) arm pain.  If you experience any of these symptoms, please call [facility] [phone].  What's Next As mentioned earlier, you will follow up with Dr. Shon Baton in 2 weeks.  At that time, we will likely remove your stitches and discuss additional aspects of your recovery.   Medication have been electronically sent to your pharmacy.  They include: Pain medication: Percocet a Muscle relaxor: Robaxin Nausea: Zofran   ACTIVITY GUIDELINES ANTERIOR CERVICAL DISECTOMY AND FUSION  Activity Discharge 2 weeks 6 weeks 3 months 6 months 1 year  Shower 5 days        Submerge the wound  no no yes     Walking outdoors yes       Lifting 5 lbs yes       Climbing stairs yes       Cooking yes       Car rides (less than 30 minutes) yes       Car rides (greater than 30 minutes) no varies yes     Air travel no varies yes     Short outings (church, visits, etc...) yes       School no no yes     Driving a car no no varies yes    Light upper extremity exercises no no varies yes    Stationary bike no no yes     Swimming (no diving) no no no varies yes   Vacuuming, laundry, mopping no no no varies yes   Biking outdoors no no no no varies yes  Light jogging no no no varies yes   Low impact aerobics no no no varies yes   Non-contact sports (tennis, golf) no no no varies yes   Hunting (no tree climbing) no no no varies yes   Dancing (non-gymnastics) no no no varies yes   Down-hill skiing (experienced skier) no no no no yes   Down-hill skiing (novice) no no no no yes   Cross-country skiing no no no no yes   Horseback riding (noncompetitive)  no no no no yes   Horseback riding (competitive) no no no no varies yes  Gardening/landscaping no no no varies yes   House repairs no no no varies varies yes  Lifting up to 50 lbs no no no no varies yes

## 2020-09-13 ENCOUNTER — Encounter (HOSPITAL_COMMUNITY): Payer: Self-pay | Admitting: Orthopedic Surgery

## 2020-09-13 MED FILL — Thrombin (Recombinant) For Soln 20000 Unit: CUTANEOUS | Qty: 1 | Status: AC

## 2022-05-20 IMAGING — US US EXTREM LOW VENOUS*R*
1 series · 14 of 24 positions shown · non-contrast
Comparison: None.

CLINICAL DATA: Right lower extremity contusion

EXAM:
RIGHT LOWER EXTREMITY VENOUS DOPPLER ULTRASOUND
TECHNIQUE: Gray-scale sonography with compression, as well as color and duplex
ultrasound, were performed to evaluate the deep venous system(s)
from the level of the common femoral vein through the popliteal and
proximal calf veins.

[Series 1: us venous img lower uni right (dvt) · portal-venous · 14 of 40 slices shown]
[im 1/40]
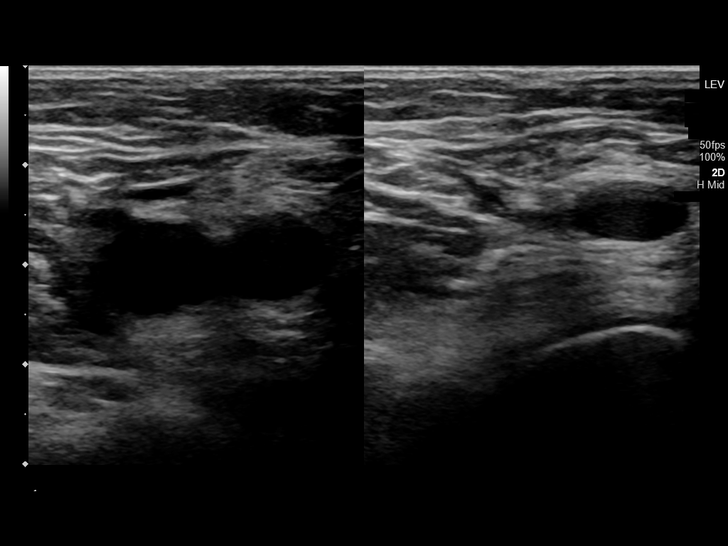
[im 4/40]
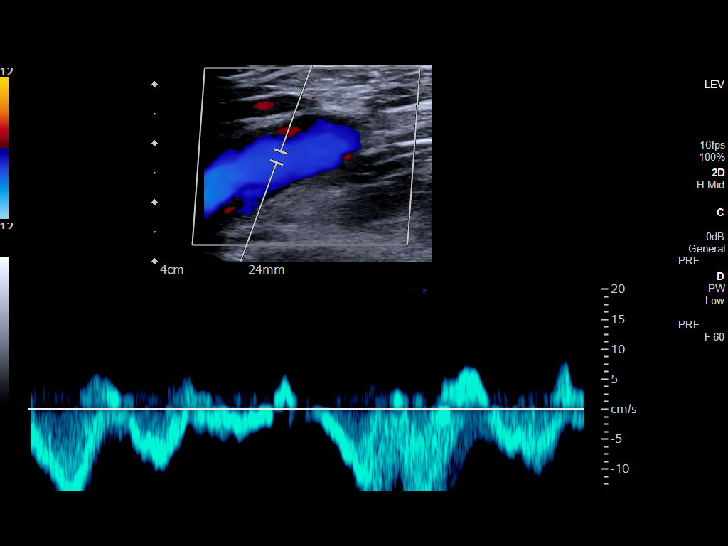
[im 7/40]
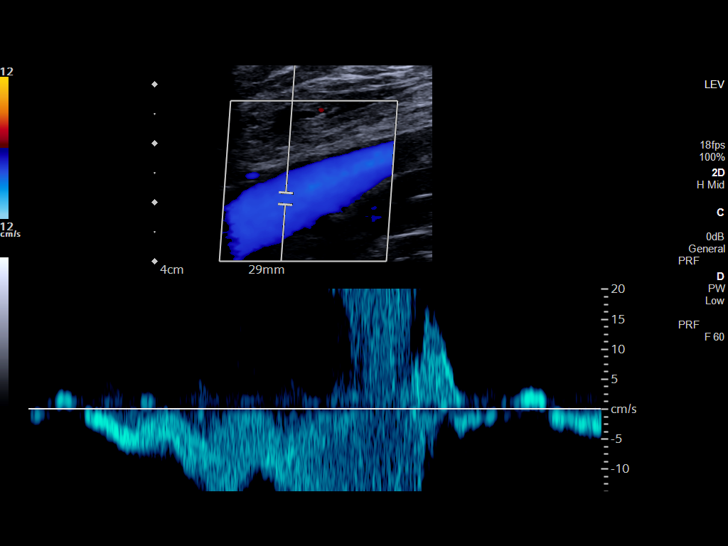
[im 11/40]
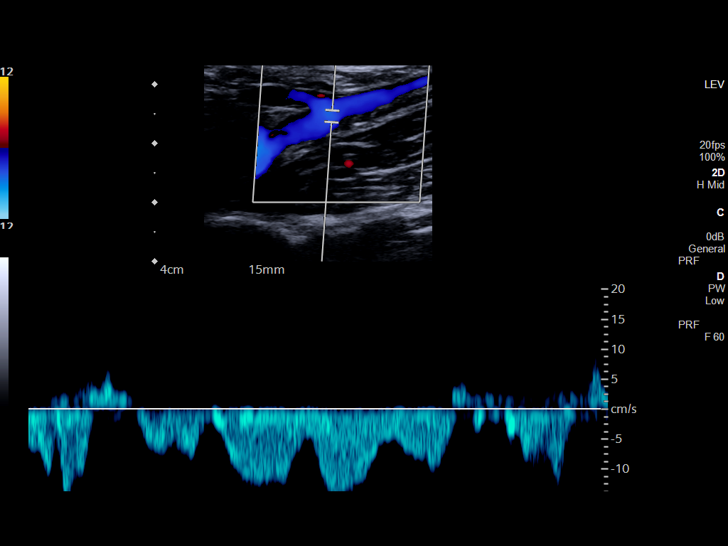
[im 12/40]
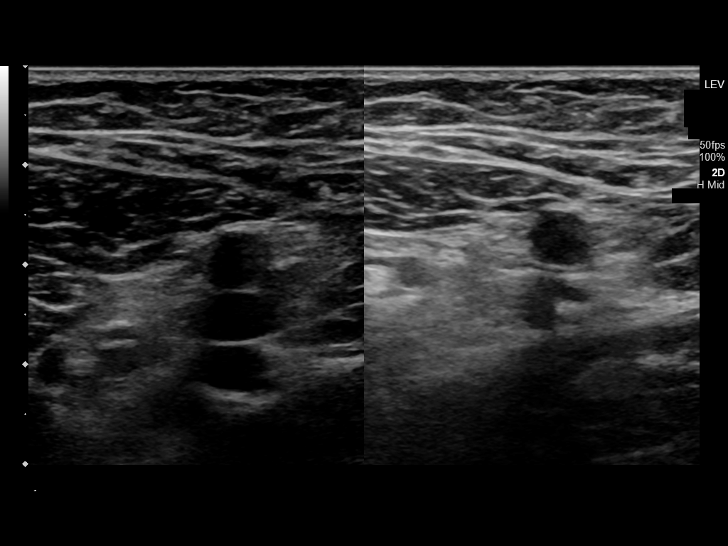
[im 16/40]
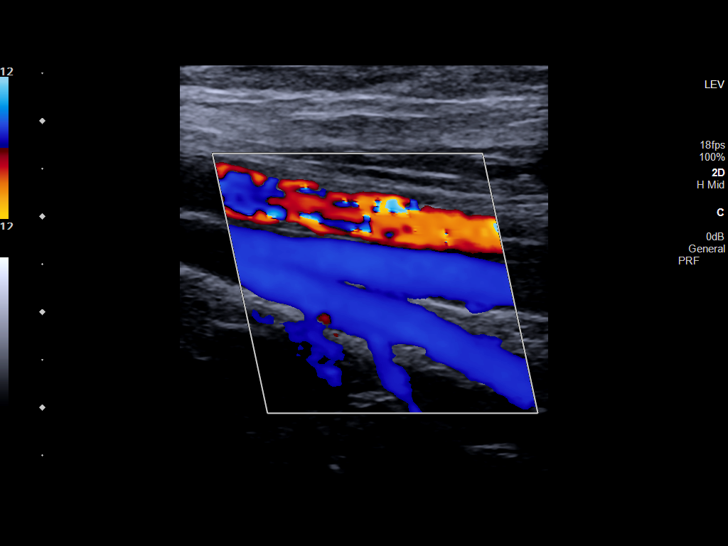
[im 19/40]
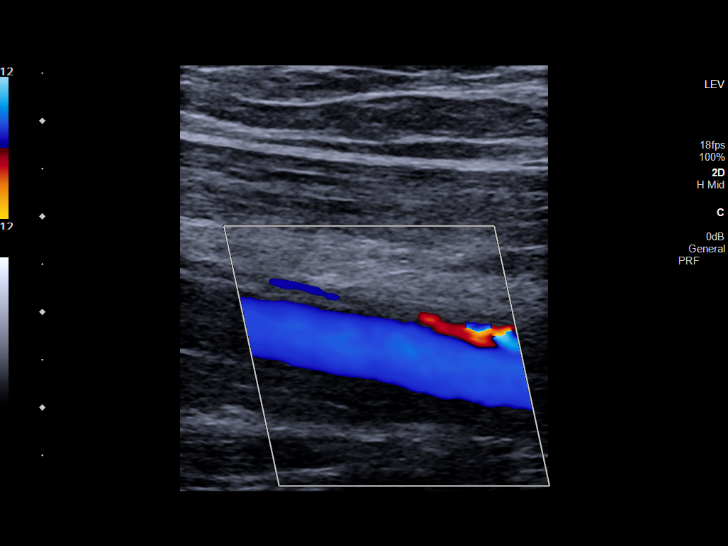
[im 21/40]
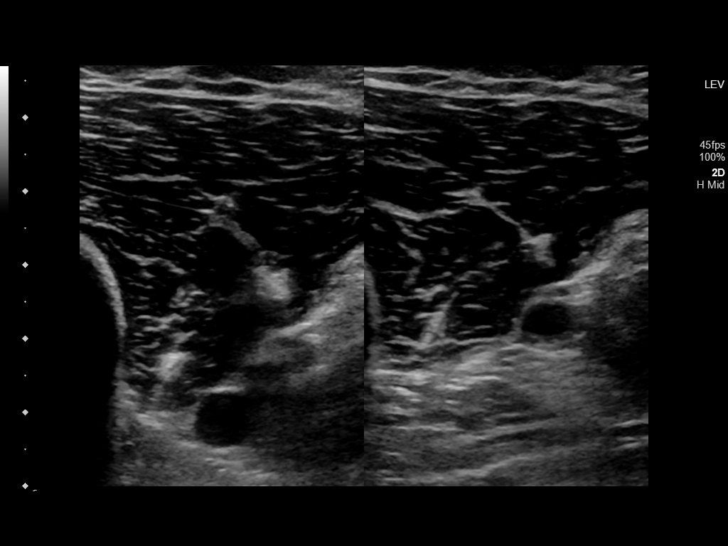
[im 24/40]
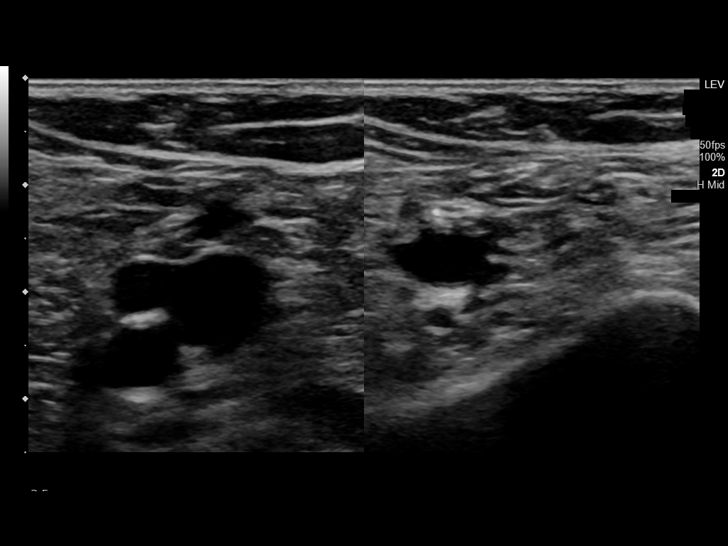
[im 28/40]
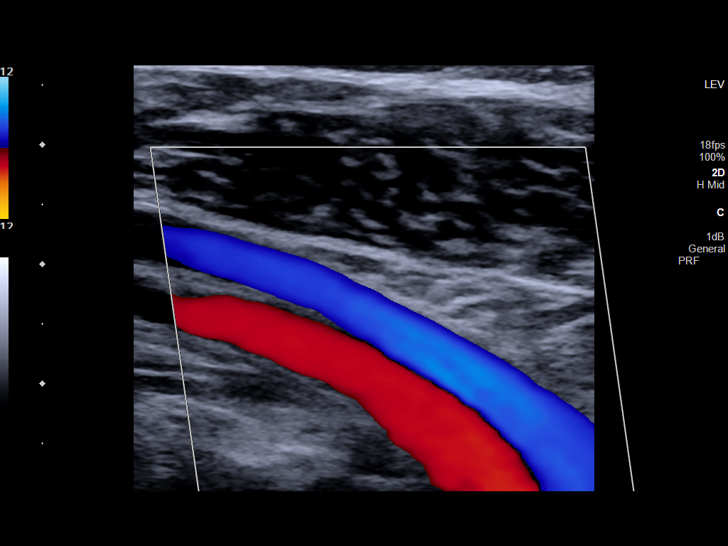
[im 31/40]
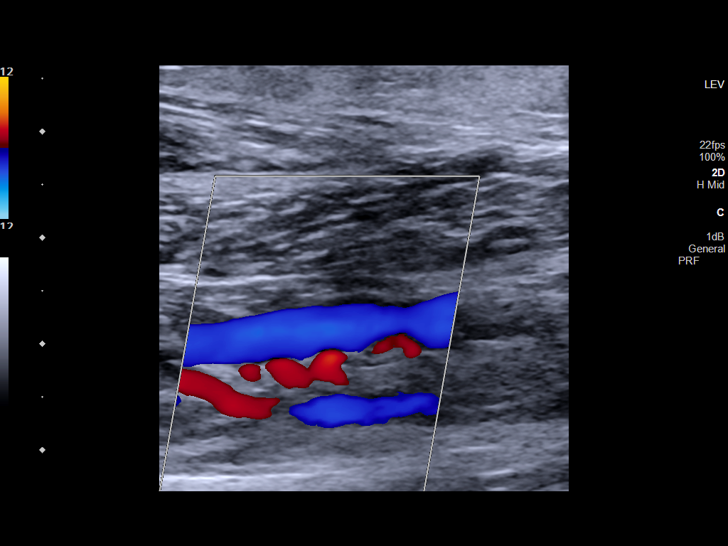
[im 33/40]
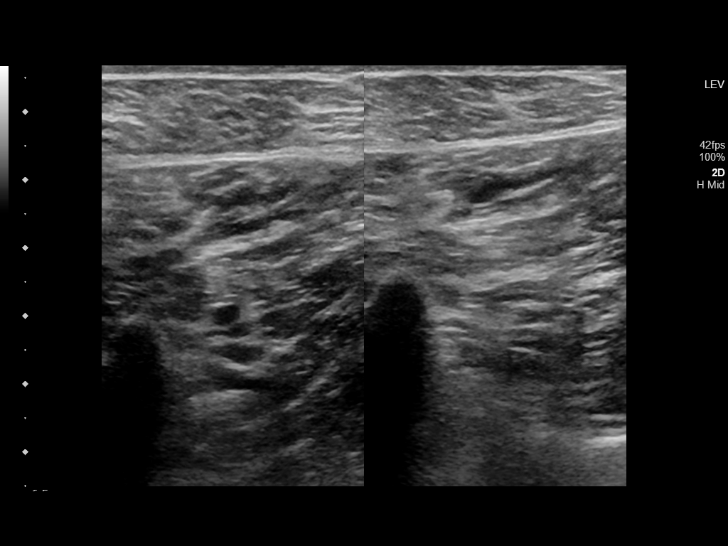
[im 36/40]
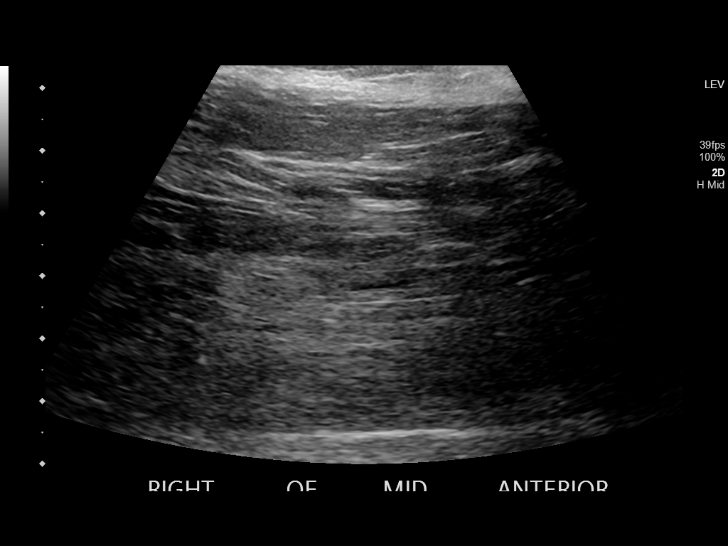
[im 40/40]
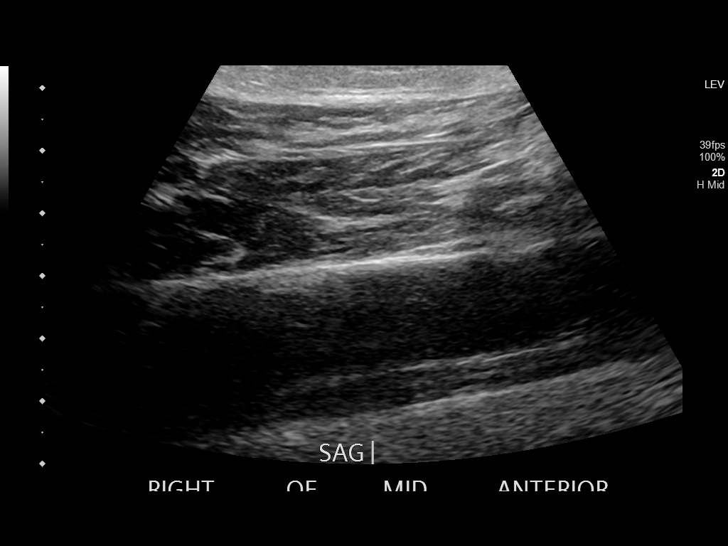

[14 of 24 positions shown; findings below may reference images not displayed]

FINDINGS: VENOUS

Normal compressibility of the common femoral, superficial femoral,
and popliteal veins, as well as the visualized calf veins.
Visualized portions of profunda femoral vein and great saphenous
vein unremarkable. No filling defects to suggest DVT on grayscale or
color Doppler imaging. Doppler waveforms show normal direction of
venous flow, normal respiratory plasticity and response to
augmentation.

Limited views of the contralateral common femoral vein are
unremarkable.

OTHER

None.

Limitations: none
IMPRESSION: Negative.

## 2022-07-09 IMAGING — RF DG C-ARM 1-60 MIN
1 series · 3 of 3 positions shown · non-contrast
Comparison: No pertinent prior exams available for comparison.

CLINICAL DATA: Surgery, elective. Additional history provided:
Cervical anterior disc arthroplasty cervical 6-7. Provided
fluoroscopy time 52 seconds (4.64 mGy).

EXAM:
CERVICAL SPINE - 2-3 VIEW; DG C-ARM 1-60 MIN

[Series 1: run · 3 of 3 slices shown]
[im 1/3]
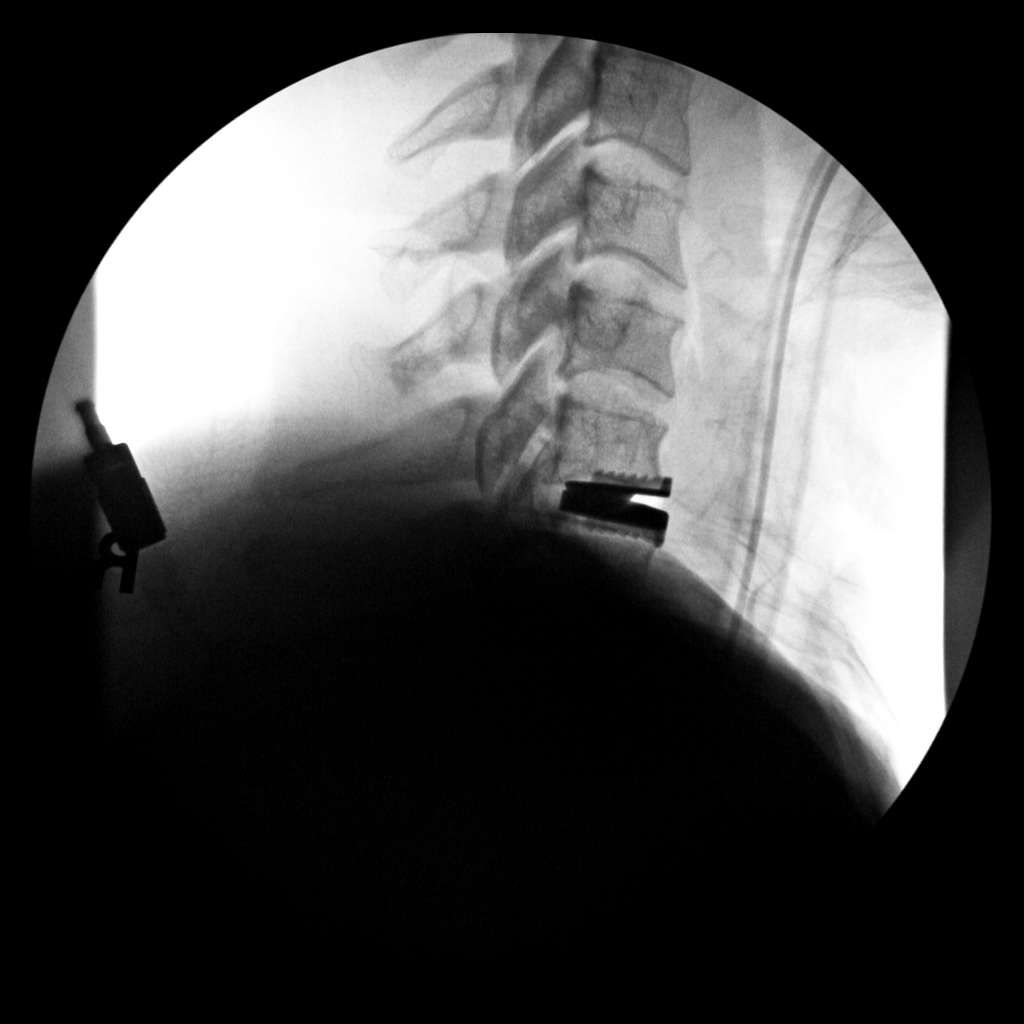
[im 2/3]
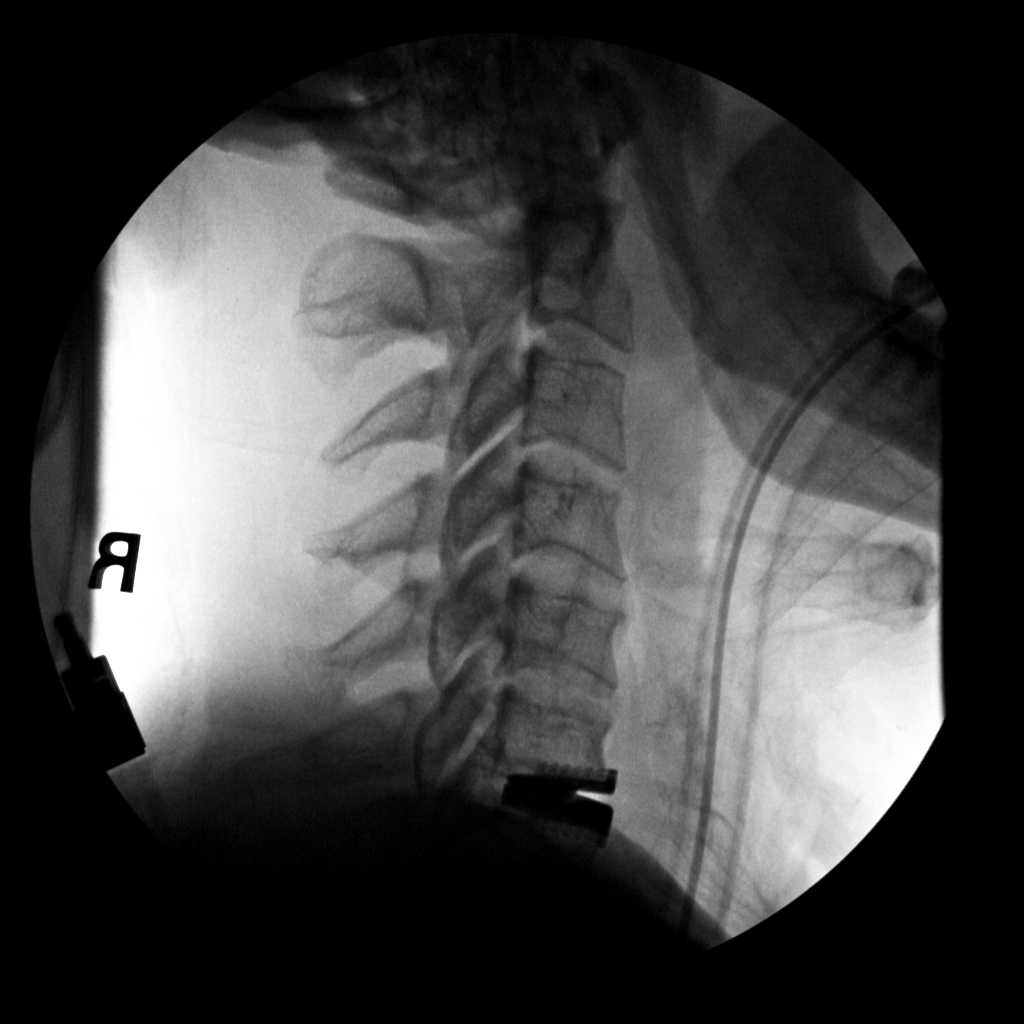
[im 3/3]
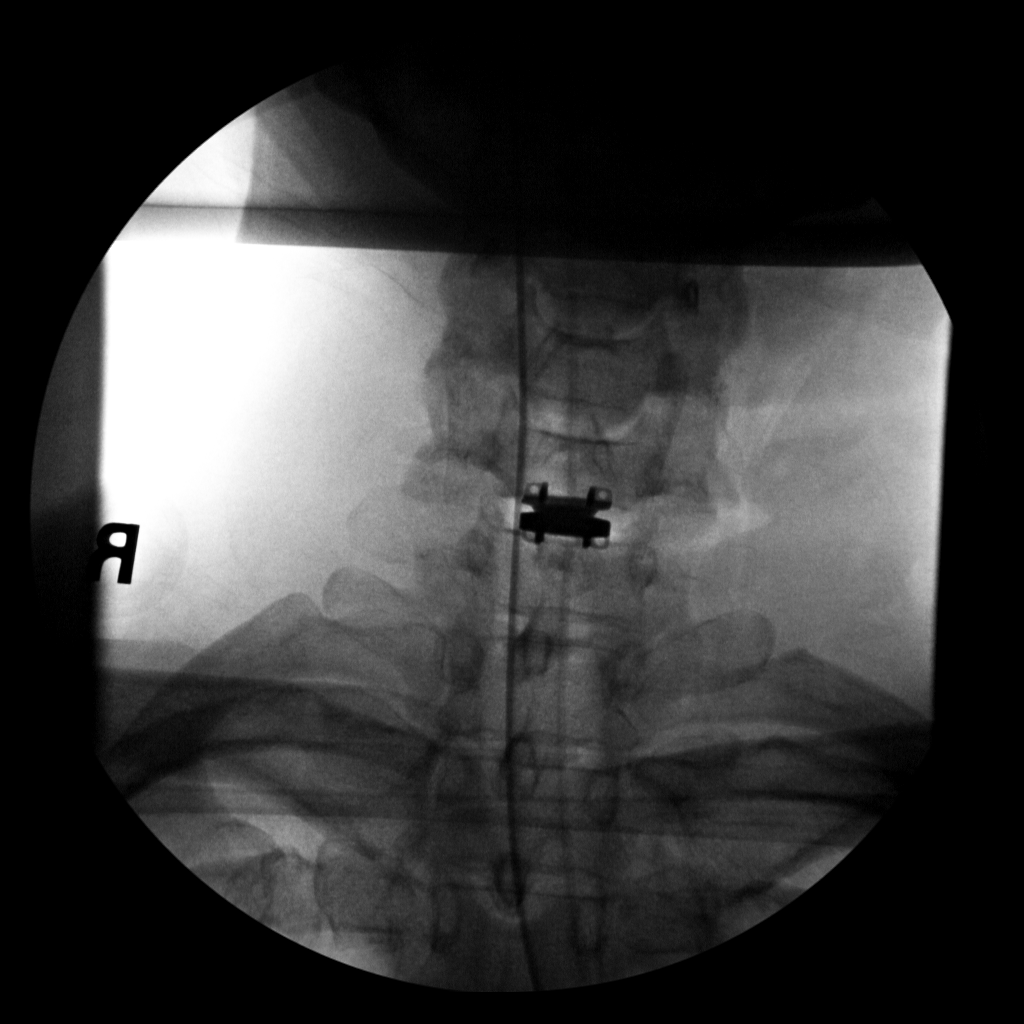

[3 of 3 positions shown; findings below may reference images not displayed]

FINDINGS: PA and lateral view intraoperative fluoroscopic images of the
cervical spine are submitted, 3 images total. On the provided
images, a disc prosthesis is present at the C6-C7 level. Partially
visualized ET tube.
IMPRESSION: Three intraoperative fluoroscopic images of the cervical spine from
C6-C7 anterior approach disc arthroplasty, as described.

## 2023-08-29 ENCOUNTER — Telehealth: Payer: Self-pay | Admitting: Family Medicine

## 2023-08-29 ENCOUNTER — Ambulatory Visit
Admission: RE | Admit: 2023-08-29 | Discharge: 2023-08-29 | Disposition: A | Discharge status: Home / Self Care | Source: Ambulatory Visit | Attending: Family Medicine | Admitting: Family Medicine

## 2023-08-29 VITALS — BP 137/77 | HR 76 | Temp 98.2°F | Resp 18

## 2023-08-29 DIAGNOSIS — L239 Allergic contact dermatitis, unspecified cause: Secondary | ICD-10-CM | POA: Diagnosis not present

## 2023-08-29 DIAGNOSIS — M7989 Other specified soft tissue disorders: Secondary | ICD-10-CM

## 2023-08-29 DIAGNOSIS — Z91018 Allergy to other foods: Secondary | ICD-10-CM

## 2023-08-29 MED ORDER — TRIAMCINOLONE ACETONIDE 0.1 % EX CREA: 1.0000 | TOPICAL_CREAM | Freq: Two times a day (BID) | CUTANEOUS | 0 refills | Status: DC

## 2023-08-29 MED ORDER — EPINEPHRINE 0.3 MG/0.3ML IJ SOAJ: 0.3000 mg | INTRAMUSCULAR | 0 refills | Status: AC | PRN

## 2023-08-29 MED ORDER — DOXYCYCLINE HYCLATE 100 MG PO CAPS: 100.0000 mg | ORAL_CAPSULE | Freq: Two times a day (BID) | ORAL | 0 refills | Status: DC

## 2023-08-29 NOTE — ED Provider Notes (Signed)
 [facility]    CSN: [id number] Arrival date & time: 08/29/23  0850      History   Chief Complaint Chief Complaint  Patient presents with   Insect Bite    HPI Hector Cox is a 33 y.o. male.   Patient presenting today with redness, swelling, slight itching and tenderness to the right forearm in an area that he thinks he got a bug bite.  Denies fever, chills, throat itching or swelling, chest tightness, wheezing, shortness of breath, nausea, vomiting.  Has taken Benadryl  last night and this morning with good improvement in the swelling.  States he has had a brown recluse bite in the past and wanted to get the area checked out in case it is something more serious.  He also states history of alpha gal and that his EpiPen 's are out of date, requesting refill.    Past Medical History:  Diagnosis Date   Allergy to alpha-gal    Medical history non-contributory     There are no active problems to display for this patient.   Past Surgical History:  Procedure Laterality Date   CERVICAL DISC ARTHROPLASTY N/A 09/10/2020   Procedure: CERVICAL ANTERIOR DISC ARTHROPLASTY CERVICAL SIX-SEVEN;  Surgeon: Burnetta Aures, MD;  Location: [facility] OR;  Service: Orthopedics;  Laterality: N/A;   WISDOM TOOTH EXTRACTION         Home Medications    Prior to Admission medications   Medication Sig Start Date End Date Taking? Authorizing Provider  EPINEPHrine  0.3 mg/0.3 mL IJ SOAJ injection Inject 0.3 mg into the muscle as needed for anaphylaxis. 08/29/23  Yes Stuart Vernell Norris, PA-C  triamcinolone cream (KENALOG) 0.1 % Apply 1 Application topically 2 (two) times daily. 08/29/23  Yes Stuart Vernell Norris, PA-C    Family History Family History  Problem Relation Age of Onset   Healthy Mother    Hypertension Father    Heart disease Father    Alcohol abuse Father     Social History Social History   Tobacco Use   Smoking status: Former    Current packs/day: 0.00    Types:  Cigarettes    Quit date: 08/2018    Years since quitting: 5.0   Smokeless tobacco: Former    Types: Chew    Quit date: 09/02/2020  Vaping Use   Vaping status: Never Used  Substance Use Topics   Alcohol use: Yes    Comment: socially   Drug use: Yes    Frequency: 5.0 times per week    Types: Marijuana     Allergies   Beef-derived drug products and Pork-derived products   Review of Systems Review of Systems Per HPI  Physical Exam Triage Vital Signs ED Triage Vitals  Encounter Vitals Group     BP 08/29/23 0906 137/77     Girls Systolic BP Percentile --      Girls Diastolic BP Percentile --      Boys Systolic BP Percentile --      Boys Diastolic BP Percentile --      Pulse Rate 08/29/23 0906 76     Resp 08/29/23 0906 18     Temp 08/29/23 0906 98.2 F (36.8 C)     Temp Source 08/29/23 0906 Oral     SpO2 08/29/23 0906 96 %     Weight --      Height --      Head Circumference --      Peak Flow --  Pain Score 08/29/23 0908 1     Pain Loc --      Pain Education --      Exclude from Growth Chart --    No data found.  Updated Vital Signs BP 137/77 (BP Location: Right Arm)   Pulse 76   Temp 98.2 F (36.8 C) (Oral)   Resp 18   SpO2 96%   Visual Acuity Right Eye Distance:   Left Eye Distance:   Bilateral Distance:    Right Eye Near:   Left Eye Near:    Bilateral Near:     Physical Exam Vitals and nursing note reviewed.  Constitutional:      Appearance: Normal appearance.  HENT:     Head: Atraumatic.     Mouth/Throat:     Mouth: Mucous membranes are moist.  Eyes:     Extraocular Movements: Extraocular movements intact.     Conjunctiva/sclera: Conjunctivae normal.  Cardiovascular:     Rate and Rhythm: Normal rate and regular rhythm.  Pulmonary:     Effort: Pulmonary effort is normal.     Breath sounds: Normal breath sounds. No wheezing or rales.  Musculoskeletal:        General: Normal range of motion.     Cervical back: Normal range of motion  and neck supple.  Skin:    General: Skin is warm and dry.     Comments: Very slight area of erythema and edema to the right forearm, no obvious entry wound or abrasions to the area and no areas of fluctuance, induration, bleeding or drainage  Neurological:     Mental Status: He is oriented to person, place, and time.     Comments: Right upper extremity neurovascularly intact  Psychiatric:        Mood and Affect: Mood normal.        Thought Content: Thought content normal.        Judgment: Judgment normal.      UC Treatments / Results  Labs (all labs ordered are listed, but only abnormal results are displayed) Labs Reviewed - No data to display  EKG   Radiology No results found.  Procedures Procedures (including critical care time)  Medications Ordered in UC Medications - No data to display  Initial Impression / Assessment and Plan / UC Course  I have reviewed the triage vital signs and the nursing notes.  Pertinent labs & imaging results that were available during my care of the patient were reviewed by me and considered in my medical decision making (see chart for details).     Do suspect localized site reaction to an insect bite or sting.  No evidence of a bacterial infection or more emergent underlying cause at this time.  Switch to a long-acting antihistamine such as Zyrtec  or Xyzal, ice, elevation, triamcinolone cream.  Return for worsening symptoms.  Will also refill EpiPen  per his request for his history of alpha gal allergy. Final Clinical Impressions(s) / UC Diagnoses   Final diagnoses:  Arm swelling  Allergic dermatitis  Allergy to alpha-gal     Discharge Instructions      Take a Zyrtec  or Xyzal once to twice daily until the swelling in your arm resolves.  You may also ice and elevate the area and I have prescribed a stronger steroid cream to apply topically to help with the swelling.  I suspect this to be an allergic site reaction to an insect bite.  I  have also prescribed a new EpiPen  as  requested for your history of alpha gal.    ED Prescriptions     Medication Sig Dispense Auth. Provider   EPINEPHrine  0.3 mg/0.3 mL IJ SOAJ injection Inject 0.3 mg into the muscle as needed for anaphylaxis. 1 each Stuart Vernell Norris, PA-C   triamcinolone cream (KENALOG) 0.1 % Apply 1 Application topically 2 (two) times daily. 80 g Stuart Vernell Norris, [place]      PDMP not reviewed this encounter.   Stuart Vernell [place], [place] 08/29/23 [phone]

## 2023-08-29 NOTE — Discharge Instructions (Signed)
 Take a Zyrtec  or Xyzal once to twice daily until the swelling in your arm resolves.  You may also ice and elevate the area and I have prescribed a stronger steroid cream to apply topically to help with the swelling.  I suspect this to be an allergic site reaction to an insect bite.  I have also prescribed a new EpiPen  as requested for your history of alpha gal.

## 2023-08-29 NOTE — Telephone Encounter (Signed)
 Patient came back in for worsening rash, swelling to arm since appt earlier today. Doxycycline sent and continue other regimen as needed

## 2023-08-29 NOTE — ED Triage Notes (Signed)
 Thinks he got an insect bite to right forearm yesterday.  Red swollen area to right forearm.  Has been taking benadryl .

## 2023-10-14 ENCOUNTER — Ambulatory Visit (HOSPITAL_COMMUNITY)
Admission: RE | Admit: 2023-10-14 | Discharge: 2023-10-14 | Disposition: A | Discharge status: Home / Self Care | Source: Ambulatory Visit | Attending: Physician Assistant | Admitting: Physician Assistant

## 2023-10-14 ENCOUNTER — Ambulatory Visit: Payer: Self-pay | Admitting: Physician Assistant

## 2023-10-14 ENCOUNTER — Encounter: Payer: Self-pay | Admitting: Emergency Medicine

## 2023-10-14 ENCOUNTER — Ambulatory Visit: Admission: EM | Admit: 2023-10-14 | Discharge: 2023-10-14 | Disposition: A | Discharge status: Home / Self Care

## 2023-10-14 DIAGNOSIS — S4991XA Unspecified injury of right shoulder and upper arm, initial encounter: Secondary | ICD-10-CM | POA: Diagnosis not present

## 2023-10-14 DIAGNOSIS — M25511 Pain in right shoulder: Secondary | ICD-10-CM | POA: Diagnosis present

## 2023-10-14 MED ORDER — HYDROCODONE-ACETAMINOPHEN 5-325 MG PO TABS: 0.5000 | ORAL_TABLET | Freq: Three times a day (TID) | ORAL | 0 refills | Status: AC | PRN

## 2023-10-14 NOTE — Discharge Instructions (Signed)
 Please go get the x-rays.  I will contact you once I have the results.  Regardless of the x-rays I would like you to follow-up with orthopedics as soon as possible.  Call them to schedule an appointment first thing tomorrow (10/15/2023).  Continue using the sling for comfort and support.  I have called in a few doses of hydrocodone  to help with your pain.  This medication is addictive and sedating so try to limit use is much as possible.  We cannot provide you any refills.  Do not drive or drink alcohol while taking this medication.  You can use over-the-counter medicine such as ibuprofen to help with your pain but do not combine it with methocarbamol as this can increase sedation.  If anything worsens and you have headache, dizziness, nausea, vomiting, numbness or tingling in the hand, weakness, increasing pain you need to go to the emergency room immediately.
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
## 2024-02-13 ENCOUNTER — Ambulatory Visit
Admission: EM | Admit: 2024-02-13 | Discharge: 2024-02-13 | Disposition: A | Discharge status: Home / Self Care | Source: Home / Self Care

## 2024-02-13 DIAGNOSIS — J3089 Other allergic rhinitis: Secondary | ICD-10-CM | POA: Diagnosis not present

## 2024-02-13 DIAGNOSIS — R051 Acute cough: Secondary | ICD-10-CM | POA: Diagnosis not present

## 2024-02-13 DIAGNOSIS — J069 Acute upper respiratory infection, unspecified: Secondary | ICD-10-CM | POA: Diagnosis not present

## 2024-02-13 LAB — POC COVID19/FLU A&B COMBO
Covid Antigen, POC: NEGATIVE
Influenza A Antigen, POC: NEGATIVE
Influenza B Antigen, POC: NEGATIVE

## 2024-02-13 LAB — POCT RAPID STREP A (OFFICE): Rapid Strep A Screen: NEGATIVE

## 2024-02-13 MED ORDER — LIDOCAINE VISCOUS HCL 2 % MT SOLN: 10.0000 mL | OROMUCOSAL | 0 refills | Status: AC | PRN

## 2024-02-13 NOTE — ED Provider Notes (Signed)
 [facility]    CSN: [id number] Arrival date & time: 02/13/24  0802      History   Chief Complaint No chief complaint on file.   HPI Hector Cox is a 33 y.o. male.   Patient presenting today with 1 day history of sore throat, cough, congestion.  Denies fever, chills, chest pain, shortness of breath, abdominal pain, vomiting, diarrhea.  So far trying Flonase with minimal relief.  History of seasonal allergies on antihistamines and Flonase as needed.  States he was taking down a very dusty fan yesterday and was wondering if this may be what is causing symptoms.    Past Medical History:  Diagnosis Date   Allergy to alpha-gal    Medical history non-contributory     There are no active problems to display for this patient.   Past Surgical History:  Procedure Laterality Date   CERVICAL DISC ARTHROPLASTY N/A 09/10/2020   Procedure: CERVICAL ANTERIOR DISC ARTHROPLASTY CERVICAL SIX-SEVEN;  Surgeon: Burnetta Aures, MD;  Location: [facility] OR;  Service: Orthopedics;  Laterality: N/A;   WISDOM TOOTH EXTRACTION         Home Medications    Prior to Admission medications  Medication Sig Start Date End Date Taking? Authorizing Provider  lidocaine  (XYLOCAINE ) 2 % solution Use as directed 10 mLs in the mouth or throat every 3 (three) hours as needed. 02/13/24  Yes Stuart Vernell Norris, PA-C  EPINEPHrine  0.3 mg/0.3 mL IJ SOAJ injection Inject 0.3 mg into the muscle as needed for anaphylaxis. 08/29/23   Stuart Vernell Norris, PA-C    Family History Family History  Problem Relation Age of Onset   Healthy Mother    Hypertension Father    Heart disease Father    Alcohol abuse Father     Social History Social History[1]   Allergies   Bovine (beef) protein-containing drug products and Porcine (pork) protein-containing drug products   Review of Systems Review of Systems PER HPI  Physical Exam Triage Vital Signs ED Triage Vitals  Encounter Vitals Group      BP 02/13/24 0813 127/80     Girls Systolic BP Percentile --      Girls Diastolic BP Percentile --      Boys Systolic BP Percentile --      Boys Diastolic BP Percentile --      Pulse Rate 02/13/24 0813 83     Resp 02/13/24 0813 20     Temp 02/13/24 0813 97.6 F (36.4 C)     Temp Source 02/13/24 0813 Oral     SpO2 02/13/24 0813 98 %     Weight --      Height --      Head Circumference --      Peak Flow --      Pain Score 02/13/24 0814 6     Pain Loc --      Pain Education --      Exclude from Growth Chart --    No data found.  Updated Vital Signs BP 127/80 (BP Location: Right Arm)   Pulse 83   Temp 97.6 F (36.4 C) (Oral)   Resp 20   SpO2 98%   Visual Acuity Right Eye Distance:   Left Eye Distance:   Bilateral Distance:    Right Eye Near:   Left Eye Near:    Bilateral Near:     Physical Exam Vitals and nursing note reviewed.  Constitutional:      Appearance: He is well-developed.  HENT:     Head: Atraumatic.     Right Ear: External ear normal.     Left Ear: External ear normal.     Nose: Rhinorrhea present.     Mouth/Throat:     Pharynx: Posterior oropharyngeal erythema present. No oropharyngeal exudate.  Eyes:     Conjunctiva/sclera: Conjunctivae normal.     Pupils: Pupils are equal, round, and reactive to light.  Cardiovascular:     Rate and Rhythm: Normal rate and regular rhythm.  Pulmonary:     Effort: Pulmonary effort is normal. No respiratory distress.     Breath sounds: No wheezing or rales.  Musculoskeletal:        General: Normal range of motion.     Cervical back: Normal range of motion and neck supple.  Lymphadenopathy:     Cervical: No cervical adenopathy.  Skin:    General: Skin is warm and dry.  Neurological:     Mental Status: He is alert and oriented to person, place, and time.  Psychiatric:        Behavior: Behavior normal.      UC Treatments / Results  Labs (all labs ordered are listed, but only abnormal results are  displayed) Labs Reviewed  POCT RAPID STREP A (OFFICE)  POC COVID19/FLU A&B COMBO    EKG   Radiology No results found.  Procedures Procedures (including critical care time)  Medications Ordered in UC Medications - No data to display  Initial Impression / Assessment and Plan / UC Course  I have reviewed the triage vital signs and the nursing notes.  Pertinent labs & imaging results that were available during my care of the patient were reviewed by me and considered in my medical decision making (see chart for details).     Vital signs and exam reassuring today, rapid strep, flu, COVID all negative.  Suspect viral respiratory infection.  Will treat with viscous lidocaine , supportive over-the-counter medications and home care.  Zyrtec  and Flonase daily for seasonal allergy control in case this is playing a part.  Work note given.  Return for worsening or unresolving symptoms.  Final Clinical Impressions(s) / UC Diagnoses   Final diagnoses:  Acute cough  Viral URI with cough  Seasonal allergic rhinitis due to other allergic trigger   Discharge Instructions   None    ED Prescriptions     Medication Sig Dispense Auth. Provider   lidocaine  (XYLOCAINE ) 2 % solution Use as directed 10 mLs in the mouth or throat every 3 (three) hours as needed. 100 mL Stuart Vernell Norris, [place]      PDMP not reviewed this encounter.    [1]  Social History Tobacco Use   Smoking status: Former    Current packs/day: 0.00    Types: Cigarettes    Quit date: 08/2018    Years since quitting: 5.4   Smokeless tobacco: Former    Types: Chew    Quit date: 09/02/2020  Vaping Use   Vaping status: Never Used  Substance Use Topics   Alcohol use: Yes    Comment: socially   Drug use: Yes    Frequency: 5.0 times per week    Types: Marijuana     Stuart Vernell Norris, PA-C 02/13/24 0911

## 2024-02-13 NOTE — ED Triage Notes (Signed)
 Pt reports sore throat started last night, some cough and congestion.
